# Patient Record
Sex: Male | Born: 1935 | Race: White | Hispanic: No | Smoking: Former smoker
Health system: Southern US, Community
[De-identification: ages and names within clinical notes are randomized; demographics above are authoritative.]

## PROBLEM LIST (undated history)

## (undated) DIAGNOSIS — K219 Gastro-esophageal reflux disease without esophagitis: Secondary | ICD-10-CM

## (undated) DIAGNOSIS — I471 Supraventricular tachycardia, unspecified: Secondary | ICD-10-CM

## (undated) DIAGNOSIS — D649 Anemia, unspecified: Secondary | ICD-10-CM

## (undated) DIAGNOSIS — I10 Essential (primary) hypertension: Secondary | ICD-10-CM

## (undated) DIAGNOSIS — I2510 Atherosclerotic heart disease of native coronary artery without angina pectoris: Secondary | ICD-10-CM

## (undated) DIAGNOSIS — R55 Syncope and collapse: Secondary | ICD-10-CM

## (undated) DIAGNOSIS — G1223 Primary lateral sclerosis: Secondary | ICD-10-CM

## (undated) DIAGNOSIS — K831 Obstruction of bile duct: Secondary | ICD-10-CM

## (undated) DIAGNOSIS — M4317 Spondylolisthesis, lumbosacral region: Secondary | ICD-10-CM

## (undated) HISTORY — DX: Atherosclerotic heart disease of native coronary artery without angina pectoris: I25.10

## (undated) HISTORY — DX: Supraventricular tachycardia: I47.1

## (undated) HISTORY — DX: Syncope and collapse: R55

## (undated) HISTORY — PX: CHOLECYSTECTOMY: SHX55

## (undated) HISTORY — DX: Supraventricular tachycardia, unspecified: I47.10

## (undated) HISTORY — DX: Obstruction of bile duct: K83.1

## (undated) HISTORY — DX: Essential (primary) hypertension: I10

## (undated) HISTORY — PX: CAROTID ENDARTERECTOMY: SUR193

## (undated) HISTORY — DX: Gastro-esophageal reflux disease without esophagitis: K21.9

---

## 2003-04-04 HISTORY — PX: ANGIOPLASTY / STENTING FEMORAL: SUR30

## 2004-03-01 ENCOUNTER — Ambulatory Visit: Payer: Self-pay | Admitting: Cardiology

## 2004-03-07 ENCOUNTER — Ambulatory Visit: Payer: Self-pay | Admitting: Cardiology

## 2004-03-08 ENCOUNTER — Inpatient Hospital Stay (HOSPITAL_COMMUNITY): Admission: AD | Admit: 2004-03-08 | Discharge: 2004-03-15 | Payer: Self-pay | Admitting: Cardiology

## 2004-03-08 ENCOUNTER — Inpatient Hospital Stay (HOSPITAL_BASED_OUTPATIENT_CLINIC_OR_DEPARTMENT_OTHER): Admission: RE | Admit: 2004-03-08 | Discharge: 2004-03-08 | Payer: Self-pay | Admitting: Cardiology

## 2004-03-08 ENCOUNTER — Ambulatory Visit: Payer: Self-pay | Admitting: Cardiology

## 2004-03-10 ENCOUNTER — Encounter (INDEPENDENT_AMBULATORY_CARE_PROVIDER_SITE_OTHER): Payer: Self-pay | Admitting: *Deleted

## 2004-03-22 ENCOUNTER — Ambulatory Visit: Payer: Self-pay | Admitting: Cardiology

## 2004-03-23 ENCOUNTER — Ambulatory Visit (HOSPITAL_COMMUNITY): Admission: RE | Admit: 2004-03-23 | Discharge: 2004-03-24 | Payer: Self-pay | Admitting: Cardiology

## 2004-04-11 ENCOUNTER — Ambulatory Visit: Payer: Self-pay | Admitting: Cardiology

## 2004-07-19 ENCOUNTER — Ambulatory Visit (HOSPITAL_COMMUNITY): Admission: RE | Admit: 2004-07-19 | Discharge: 2004-07-19 | Payer: Self-pay | Admitting: Cardiology

## 2004-07-21 ENCOUNTER — Ambulatory Visit: Payer: Self-pay | Admitting: Cardiology

## 2004-09-12 ENCOUNTER — Ambulatory Visit: Payer: Self-pay | Admitting: Family Medicine

## 2004-09-15 ENCOUNTER — Ambulatory Visit (HOSPITAL_COMMUNITY): Admission: RE | Admit: 2004-09-15 | Discharge: 2004-09-15 | Payer: Self-pay | Admitting: Cardiology

## 2004-09-26 ENCOUNTER — Ambulatory Visit: Payer: Self-pay | Admitting: Family Medicine

## 2005-01-23 ENCOUNTER — Ambulatory Visit: Payer: Self-pay | Admitting: Cardiology

## 2005-05-01 ENCOUNTER — Inpatient Hospital Stay (HOSPITAL_COMMUNITY): Admission: RE | Admit: 2005-05-01 | Discharge: 2005-05-02 | Payer: Self-pay | Admitting: *Deleted

## 2005-05-01 ENCOUNTER — Encounter (INDEPENDENT_AMBULATORY_CARE_PROVIDER_SITE_OTHER): Payer: Self-pay | Admitting: Specialist

## 2006-03-09 ENCOUNTER — Ambulatory Visit: Payer: Self-pay | Admitting: Cardiology

## 2006-07-16 ENCOUNTER — Ambulatory Visit: Payer: Self-pay | Admitting: Cardiology

## 2006-08-21 ENCOUNTER — Ambulatory Visit: Payer: Self-pay | Admitting: Cardiology

## 2006-08-31 ENCOUNTER — Ambulatory Visit: Payer: Self-pay | Admitting: Cardiology

## 2006-09-17 ENCOUNTER — Ambulatory Visit: Payer: Self-pay | Admitting: Family Medicine

## 2006-10-01 ENCOUNTER — Ambulatory Visit: Payer: Self-pay | Admitting: Cardiology

## 2006-12-17 ENCOUNTER — Ambulatory Visit: Payer: Self-pay | Admitting: Cardiology

## 2006-12-27 ENCOUNTER — Ambulatory Visit: Payer: Self-pay | Admitting: *Deleted

## 2007-04-27 ENCOUNTER — Emergency Department (HOSPITAL_COMMUNITY): Admission: EM | Admit: 2007-04-27 | Discharge: 2007-04-27 | Payer: Self-pay | Admitting: Emergency Medicine

## 2007-05-06 ENCOUNTER — Ambulatory Visit: Payer: Self-pay | Admitting: Cardiology

## 2007-08-07 ENCOUNTER — Ambulatory Visit (HOSPITAL_COMMUNITY): Admission: RE | Admit: 2007-08-07 | Discharge: 2007-08-07 | Payer: Self-pay | Admitting: Family Medicine

## 2007-08-25 ENCOUNTER — Emergency Department (HOSPITAL_COMMUNITY): Admission: EM | Admit: 2007-08-25 | Discharge: 2007-08-25 | Payer: Self-pay | Admitting: Emergency Medicine

## 2007-11-10 ENCOUNTER — Encounter: Admission: RE | Admit: 2007-11-10 | Discharge: 2007-11-10 | Payer: Self-pay | Admitting: Neurosurgery

## 2008-02-04 ENCOUNTER — Emergency Department (HOSPITAL_COMMUNITY): Admission: EM | Admit: 2008-02-04 | Discharge: 2008-02-04 | Payer: Self-pay | Admitting: Emergency Medicine

## 2008-03-10 ENCOUNTER — Ambulatory Visit: Payer: Self-pay | Admitting: Cardiology

## 2008-04-08 ENCOUNTER — Emergency Department (HOSPITAL_COMMUNITY): Admission: EM | Admit: 2008-04-08 | Discharge: 2008-04-08 | Payer: Self-pay | Admitting: Emergency Medicine

## 2008-04-28 ENCOUNTER — Inpatient Hospital Stay (HOSPITAL_COMMUNITY): Admission: EM | Admit: 2008-04-28 | Discharge: 2008-05-01 | Payer: Self-pay | Admitting: Emergency Medicine

## 2008-04-29 ENCOUNTER — Encounter (INDEPENDENT_AMBULATORY_CARE_PROVIDER_SITE_OTHER): Payer: Self-pay | Admitting: General Surgery

## 2008-07-06 ENCOUNTER — Encounter: Admission: RE | Admit: 2008-07-06 | Discharge: 2008-09-01 | Payer: Self-pay | Admitting: Family Medicine

## 2008-09-16 ENCOUNTER — Ambulatory Visit: Payer: Self-pay | Admitting: Cardiology

## 2008-10-27 ENCOUNTER — Encounter: Admission: RE | Admit: 2008-10-27 | Discharge: 2008-10-27 | Payer: Self-pay | Admitting: Neurosurgery

## 2008-11-06 ENCOUNTER — Inpatient Hospital Stay (HOSPITAL_COMMUNITY): Admission: RE | Admit: 2008-11-06 | Discharge: 2008-11-13 | Payer: Self-pay | Admitting: Neurosurgery

## 2008-11-10 ENCOUNTER — Ambulatory Visit: Payer: Self-pay | Admitting: Physical Medicine & Rehabilitation

## 2009-02-03 ENCOUNTER — Encounter (HOSPITAL_COMMUNITY): Admission: RE | Admit: 2009-02-03 | Discharge: 2009-03-05 | Payer: Self-pay | Admitting: Neurosurgery

## 2010-02-01 HISTORY — PX: BACLOFEN PUMP REFILL: SHX1212

## 2010-03-12 IMAGING — CT CT PELVIS W/ CM
1 of 3 series · 13 of 32 positions shown, 18 images · IV contrast (Omnipaque 300)
Comparison: None available.

CT ABDOMEN

CLINICAL DATA: Abdominal pain.  Right upper quadrant pain.

CT ABDOMEN AND PELVIS WITH CONTRAST
TECHNIQUE: Multidetector CT imaging of the abdomen and pelvis was
performed using the standard protocol following bolus
administration of intravenous contrast.
Contrast: 100 ml 6mnipaque-HLL.

[Series 2: abd_pel 5.0 b40f · axial · 0.69mm/px · z∈[-484,-68]mm · 13 of 95 slices shown, 18 images]
[im 6/95  soft-tissue]
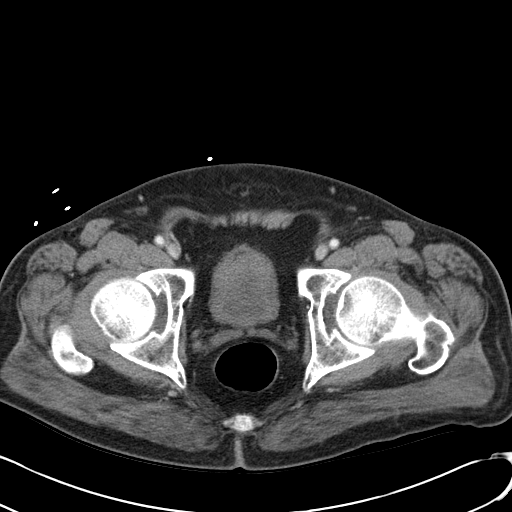
[im 6/95  bone]
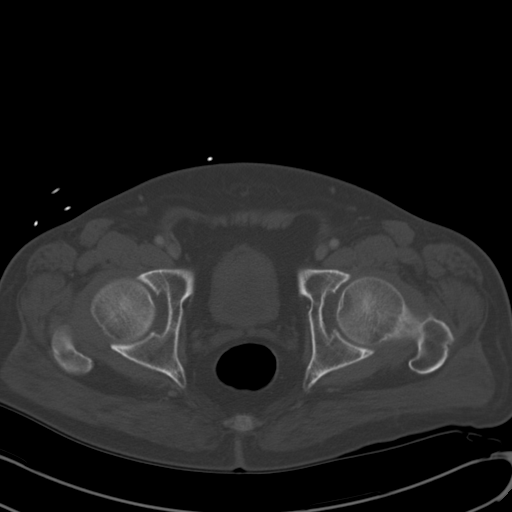
[im 16/95  soft-tissue]
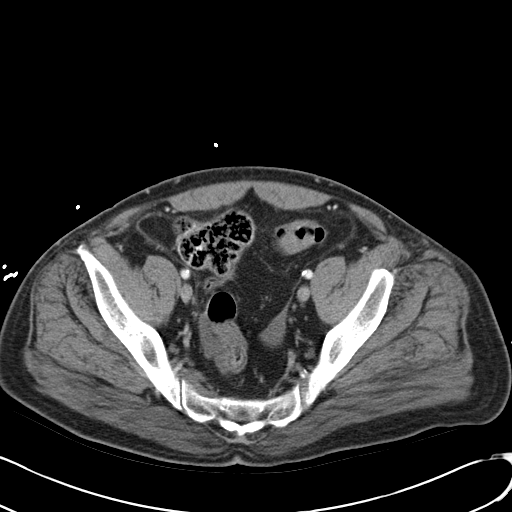
[im 21/95  soft-tissue]
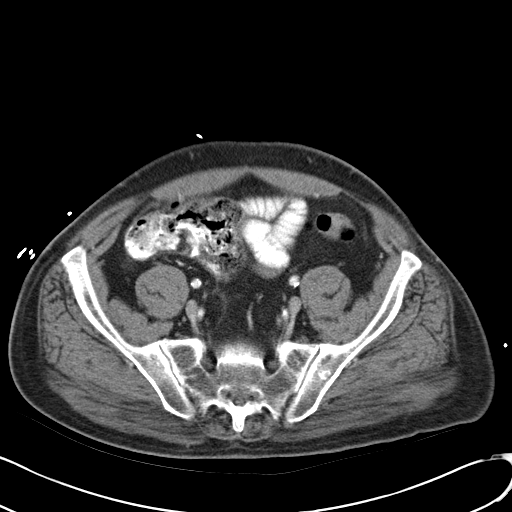
[im 27/95  soft-tissue]
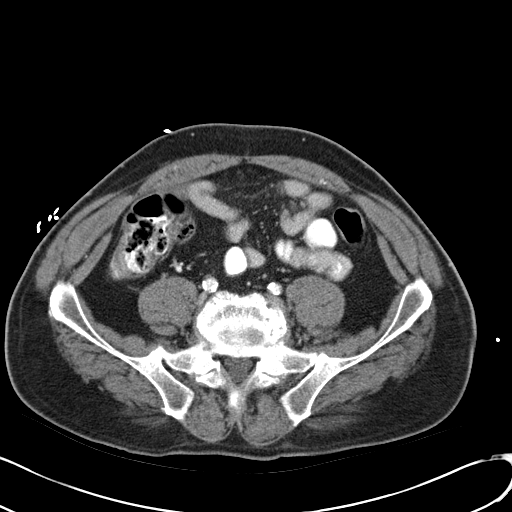
[im 37/95  soft-tissue]
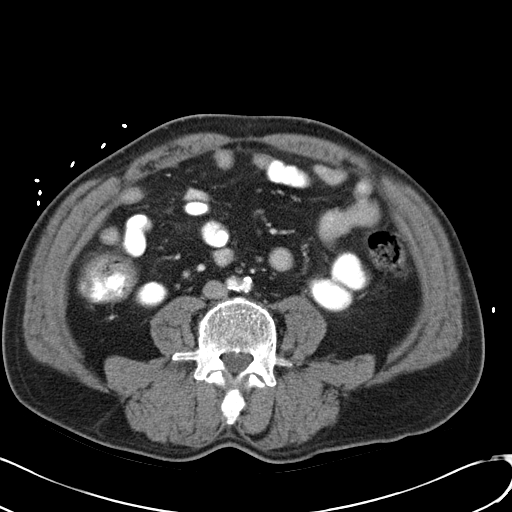
[im 42/95  soft-tissue]
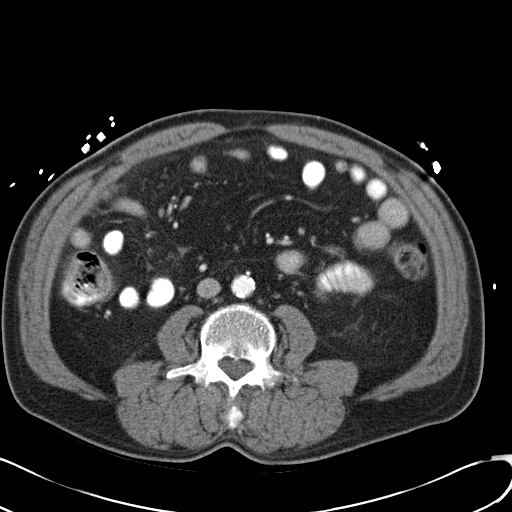
[im 53/95  soft-tissue]
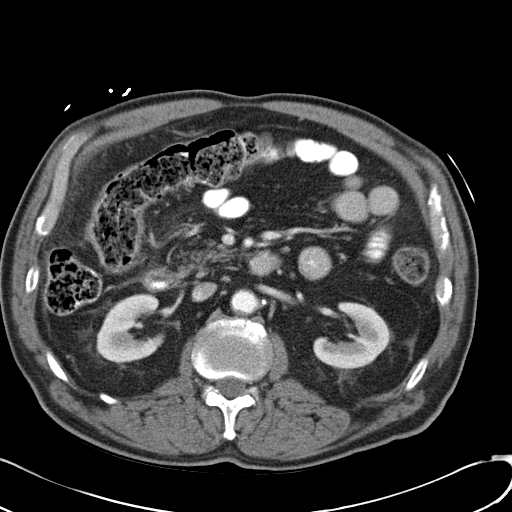
[im 58/95  soft-tissue]
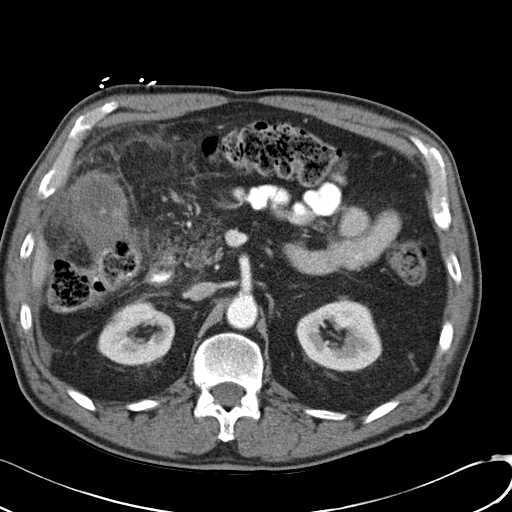
[im 68/95  soft-tissue]
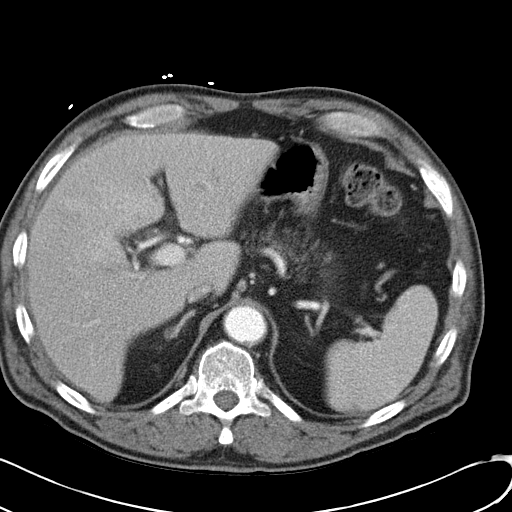
[im 68/95  bone]
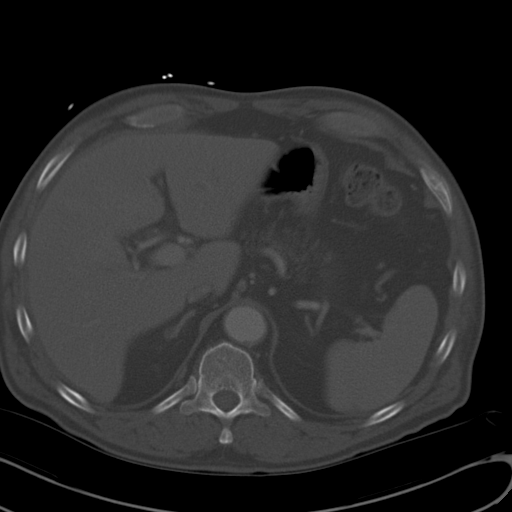
[im 74/95  soft-tissue]
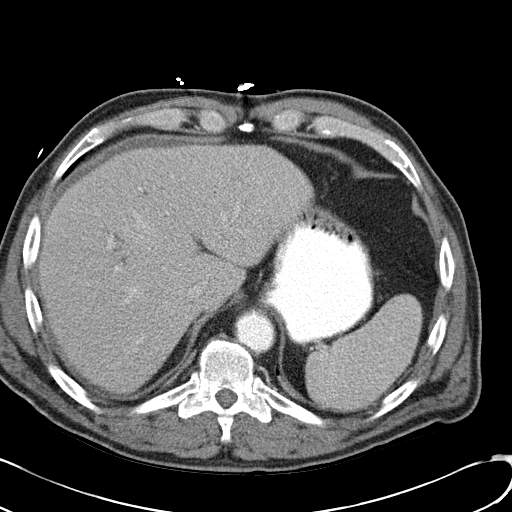
[im 74/95  lung]
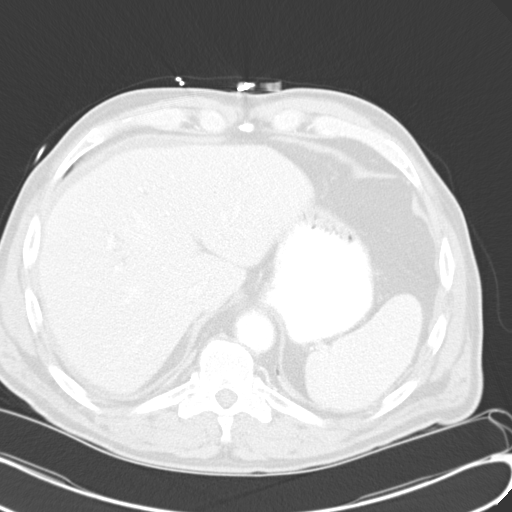
[im 79/95  soft-tissue]
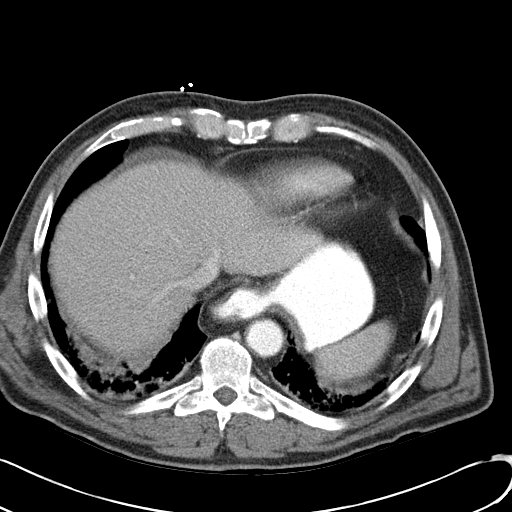
[im 79/95  lung]
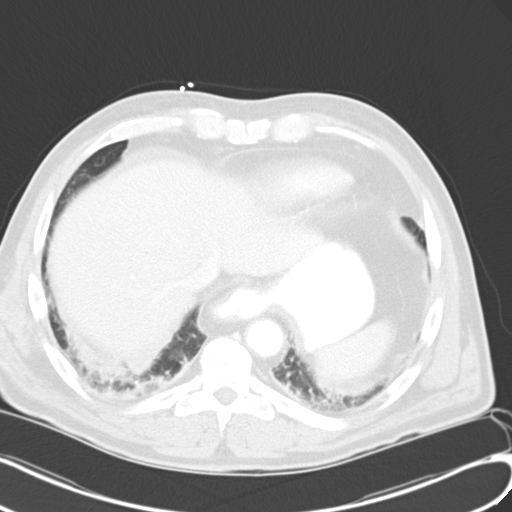
[im 84/95  lung]
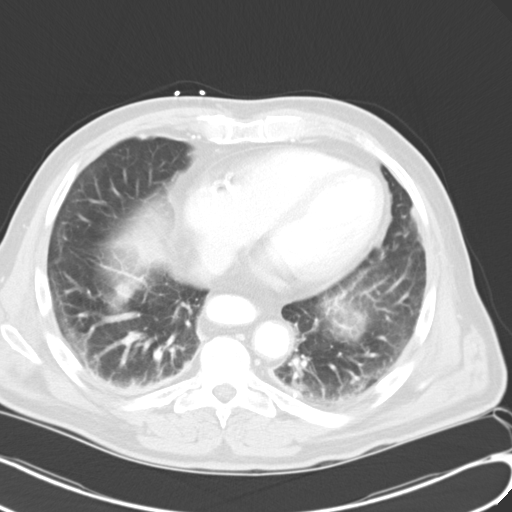
[im 89/95  soft-tissue]
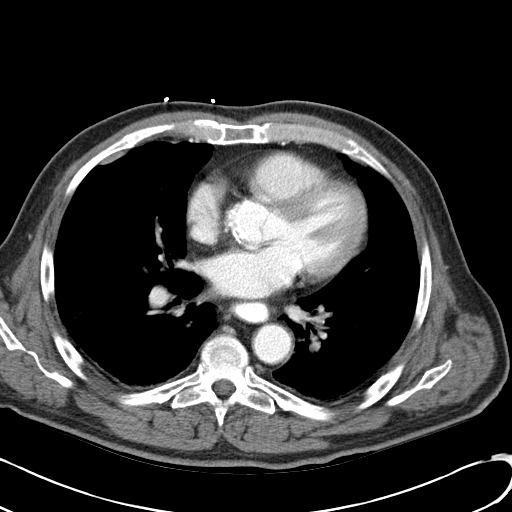
[im 89/95  lung]
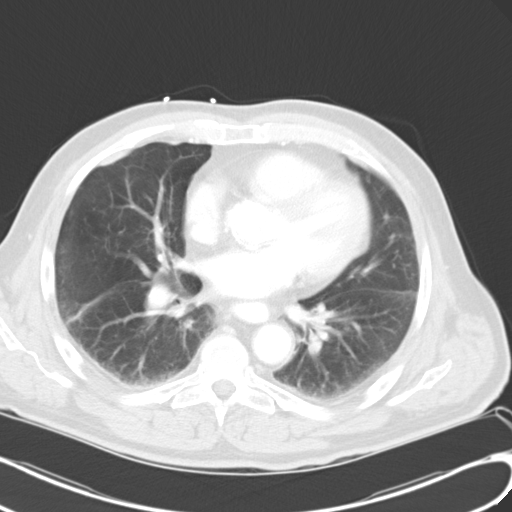

[13 of 32 positions shown; findings below may reference images not displayed]

FINDINGS: Bibasilar dependent atelectatic change.  Contrast
material is seen the distal esophagus compatible with reflux or
poor motility.  There is mild cardiomegaly.  No pleural or
pericardial effusion.

The patient has perihepatic ascites.  There is extensive
infiltration of fat about the gallbladder and the gallbladder wall
demonstrates added enhancement.  The patient has mild intrahepatic
biliary ductal dilatation.  Small calcifications identified the
liver but the liver is otherwise normal in appearance.

The adrenal glands, spleen and kidneys appear normal.  The pancreas
is fatty replaced but otherwise unremarkable.  Small bowel have a
normal CT appearance.  Compression fracture deformity the superior
plate of T12 is chronic. Bilateral L5 pars interarticularis defects
with minimal associated anterolisthesis.  No other focal bony
abnormality.
IMPRESSION: 1.  Inflammatory process in the right upper quadrant the abdomen
appears centered about the gallbladder is consistent with acute
cholecystitis.  The patient has minimal intrahepatic biliary ductal
dilatation.  No definite ductal stone is seen by CT scan.
2.  Bibasilar atelectasis.
3.  Fatty replaced of the pancreas noted.
4.  Bilateral L5 pars interarticularis defects.
5.  Gastroesophageal reflux versus poor esophageal motility.

CT PELVIS
FINDINGS: There is a small volume of free pelvic fluid.  The
patient has some diverticular disease the colon without evidence of
diverticulitis.  The colon is otherwise unremarkable in appearance.
The appendix is visualized and unremarkable.  No pelvic
lymphadenopathy.  No focal bony abnormality.
IMPRESSION: 1.  No acute finding the pelvis.
2.  Diverticulosis without diverticulitis.

## 2010-07-09 LAB — CBC
HCT: 42.1 % (ref 39.0–52.0)
Hemoglobin: 14.6 g/dL (ref 13.0–17.0)
MCHC: 34.7 g/dL (ref 30.0–36.0)
MCV: 89.3 fL (ref 78.0–100.0)
Platelets: 220 10*3/uL (ref 150–400)
RBC: 4.71 MIL/uL (ref 4.22–5.81)
RDW: 14 % (ref 11.5–15.5)
WBC: 5.1 10*3/uL (ref 4.0–10.5)

## 2010-07-09 LAB — URINALYSIS, MICROSCOPIC ONLY
Bilirubin Urine: NEGATIVE
Ketones, ur: NEGATIVE mg/dL
Nitrite: NEGATIVE
Protein, ur: NEGATIVE mg/dL
Urobilinogen, UA: 0.2 mg/dL (ref 0.0–1.0)
pH: 8 (ref 5.0–8.0)

## 2010-07-09 LAB — TYPE AND SCREEN
ABO/RH(D): O POS
Antibody Screen: NEGATIVE

## 2010-07-18 LAB — BASIC METABOLIC PANEL
BUN: 20 mg/dL (ref 6–23)
CO2: 25 mEq/L (ref 19–32)
CO2: 26 mEq/L (ref 19–32)
Chloride: 102 mEq/L (ref 96–112)
Creatinine, Ser: 1.54 mg/dL — ABNORMAL HIGH (ref 0.4–1.5)
GFR calc Af Amer: 54 mL/min — ABNORMAL LOW (ref >60)
GFR calc Af Amer: >60 mL/min (ref >60)
GFR calc non Af Amer: >60 mL/min (ref >60)
Glucose, Bld: 122 mg/dL — ABNORMAL HIGH (ref 70–99)
Potassium: 4.1 mEq/L (ref 3.5–5.1)
Sodium: 136 mEq/L (ref 135–145)

## 2010-07-18 LAB — DIFFERENTIAL
Basophils Absolute: 0 10*3/uL (ref 0.0–0.1)
Basophils Absolute: 0 10*3/uL (ref 0.0–0.1)
Basophils Absolute: 0.1 10*3/uL (ref 0.0–0.1)
Basophils Relative: 0 % (ref 0–1)
Eosinophils Absolute: 0 10*3/uL (ref 0.0–0.7)
Eosinophils Absolute: 0 10*3/uL (ref 0.0–0.7)
Eosinophils Relative: 0 % (ref 0–5)
Eosinophils Relative: 0 % (ref 0–5)
Eosinophils Relative: 1 % (ref 0–5)
Lymphocytes Relative: 2 % — ABNORMAL LOW (ref 12–46)
Lymphocytes Relative: 4 % — ABNORMAL LOW (ref 12–46)
Lymphocytes Relative: 8 % — ABNORMAL LOW (ref 12–46)
Lymphs Abs: 0.3 10*3/uL — ABNORMAL LOW (ref 0.7–4.0)
Lymphs Abs: 0.6 10*3/uL — ABNORMAL LOW (ref 0.7–4.0)
Monocytes Absolute: 0.3 10*3/uL (ref 0.1–1.0)
Monocytes Relative: 3 % (ref 3–12)
Monocytes Relative: 5 % (ref 3–12)
Monocytes Relative: 5 % (ref 3–12)
Neutro Abs: 11.5 10*3/uL — ABNORMAL HIGH (ref 1.7–7.7)
Neutro Abs: 7.9 10*3/uL — ABNORMAL HIGH (ref 1.7–7.7)
Neutrophils Relative %: 90 % — ABNORMAL HIGH (ref 43–77)
Neutrophils Relative %: 90 % — ABNORMAL HIGH (ref 43–77)

## 2010-07-18 LAB — COMPREHENSIVE METABOLIC PANEL
ALT: 15 U/L (ref 0–53)
AST: 18 U/L (ref 0–37)
Albumin: 2.3 g/dL — ABNORMAL LOW (ref 3.5–5.2)
BUN: 15 mg/dL (ref 6–23)
BUN: 18 mg/dL (ref 6–23)
CO2: 27 mEq/L (ref 19–32)
CO2: 30 mEq/L (ref 19–32)
Calcium: 8.6 mg/dL (ref 8.4–10.5)
Calcium: 9 mg/dL (ref 8.4–10.5)
Chloride: 102 mEq/L (ref 96–112)
Chloride: 104 mEq/L (ref 96–112)
Creatinine, Ser: 1.18 mg/dL (ref 0.4–1.5)
Creatinine, Ser: 1.42 mg/dL (ref 0.4–1.5)
GFR calc Af Amer: >60 mL/min (ref >60)
GFR calc Af Amer: >60 mL/min (ref >60)
GFR calc non Af Amer: 49 mL/min — ABNORMAL LOW (ref >60)
GFR calc non Af Amer: >60 mL/min (ref >60)
Glucose, Bld: 97 mg/dL (ref 70–99)
Sodium: 136 mEq/L (ref 135–145)
Total Bilirubin: 0.7 mg/dL (ref 0.3–1.2)
Total Bilirubin: 0.9 mg/dL (ref 0.3–1.2)
Total Protein: 5 g/dL — ABNORMAL LOW (ref 6.0–8.3)

## 2010-07-18 LAB — CBC
HCT: 30.8 % — ABNORMAL LOW (ref 39.0–52.0)
HCT: 32.9 % — ABNORMAL LOW (ref 39.0–52.0)
HCT: 42 % (ref 39.0–52.0)
Hemoglobin: 11.2 g/dL — ABNORMAL LOW (ref 13.0–17.0)
MCHC: 33.7 g/dL (ref 30.0–36.0)
MCHC: 33.8 g/dL (ref 30.0–36.0)
MCHC: 34 g/dL (ref 30.0–36.0)
MCHC: 34.4 g/dL (ref 30.0–36.0)
MCHC: 34.7 g/dL (ref 30.0–36.0)
MCV: 88.8 fL (ref 78.0–100.0)
MCV: 89 fL (ref 78.0–100.0)
MCV: 89.8 fL (ref 78.0–100.0)
Platelets: 201 10*3/uL (ref 150–400)
Platelets: 302 10*3/uL (ref 150–400)
RBC: 3.43 MIL/uL — ABNORMAL LOW (ref 4.22–5.81)
RBC: 3.56 MIL/uL — ABNORMAL LOW (ref 4.22–5.81)
RBC: 3.68 MIL/uL — ABNORMAL LOW (ref 4.22–5.81)
RBC: 4.01 MIL/uL — ABNORMAL LOW (ref 4.22–5.81)
RBC: 4.73 MIL/uL (ref 4.22–5.81)
RDW: 13.3 % (ref 11.5–15.5)
RDW: 13.7 % (ref 11.5–15.5)
WBC: 13 10*3/uL — ABNORMAL HIGH (ref 4.0–10.5)
WBC: 8.7 10*3/uL (ref 4.0–10.5)

## 2010-07-18 LAB — URINALYSIS, ROUTINE W REFLEX MICROSCOPIC
Leukocytes, UA: NEGATIVE
Protein, ur: 30 mg/dL — AB
Urobilinogen, UA: 0.2 mg/dL (ref 0.0–1.0)

## 2010-07-18 LAB — POCT I-STAT, CHEM 8
BUN: 21 mg/dL (ref 6–23)
Calcium, Ion: 1.17 mmol/L (ref 1.12–1.32)
Chloride: 104 mEq/L (ref 96–112)
Creatinine, Ser: 1.4 mg/dL (ref 0.4–1.5)

## 2010-07-18 LAB — LACTIC ACID, PLASMA: Lactic Acid, Venous: 2.3 mmol/L — ABNORMAL HIGH (ref 0.5–2.2)

## 2010-07-18 LAB — LIPASE, BLOOD: Lipase: 16 U/L (ref 11–59)

## 2010-07-18 LAB — URINE MICROSCOPIC-ADD ON

## 2010-08-02 ENCOUNTER — Emergency Department (HOSPITAL_COMMUNITY): Payer: Medicare Other

## 2010-08-02 ENCOUNTER — Emergency Department (HOSPITAL_COMMUNITY)
Admission: EM | Admit: 2010-08-02 | Discharge: 2010-08-02 | Disposition: A | Discharge status: Home / Self Care | Payer: Medicare Other | Attending: Emergency Medicine | Admitting: Emergency Medicine

## 2010-08-02 DIAGNOSIS — M129 Arthropathy, unspecified: Secondary | ICD-10-CM | POA: Insufficient documentation

## 2010-08-02 DIAGNOSIS — R29898 Other symptoms and signs involving the musculoskeletal system: Secondary | ICD-10-CM | POA: Insufficient documentation

## 2010-08-02 DIAGNOSIS — M5430 Sciatica, unspecified side: Secondary | ICD-10-CM | POA: Insufficient documentation

## 2010-08-02 DIAGNOSIS — M25559 Pain in unspecified hip: Secondary | ICD-10-CM | POA: Insufficient documentation

## 2010-08-02 DIAGNOSIS — I519 Heart disease, unspecified: Secondary | ICD-10-CM | POA: Insufficient documentation

## 2010-08-02 DIAGNOSIS — Z981 Arthrodesis status: Secondary | ICD-10-CM | POA: Insufficient documentation

## 2010-08-02 DIAGNOSIS — Z7982 Long term (current) use of aspirin: Secondary | ICD-10-CM | POA: Insufficient documentation

## 2010-08-02 DIAGNOSIS — E7800 Pure hypercholesterolemia, unspecified: Secondary | ICD-10-CM | POA: Insufficient documentation

## 2010-08-02 DIAGNOSIS — G8929 Other chronic pain: Secondary | ICD-10-CM | POA: Insufficient documentation

## 2010-08-02 DIAGNOSIS — M6250 Muscle wasting and atrophy, not elsewhere classified, unspecified site: Secondary | ICD-10-CM | POA: Insufficient documentation

## 2010-08-02 DIAGNOSIS — Z9889 Other specified postprocedural states: Secondary | ICD-10-CM | POA: Insufficient documentation

## 2010-08-02 DIAGNOSIS — Z79899 Other long term (current) drug therapy: Secondary | ICD-10-CM | POA: Insufficient documentation

## 2010-08-02 DIAGNOSIS — I10 Essential (primary) hypertension: Secondary | ICD-10-CM | POA: Insufficient documentation

## 2010-08-02 DIAGNOSIS — M79609 Pain in unspecified limb: Secondary | ICD-10-CM | POA: Insufficient documentation

## 2010-08-02 DIAGNOSIS — R209 Unspecified disturbances of skin sensation: Secondary | ICD-10-CM | POA: Insufficient documentation

## 2010-08-16 NOTE — Assessment & Plan Note (Signed)
[facility]                          [facility] NOTE   NAME:[name], [name]                        MRN:          [id number]  DATE:05/06/2007                            DOB:          07-13-1935    CARDIOLOGIST:  Luis Abed, MD, [facility]   PRIMARY CARE PHYSICIAN:  Delaney Meigs, MD   REASON FOR VISIT:  Four month followup.   HISTORY OF PRESENT ILLNESS:  [name] is a 75 year old male patient,  with a history of coronary artery disease, status post Taxus drug-  eluting stent placement to the RCA in December 2005, who presents to the  office today for followup.   Since last being seen, the patient presented to [facility] Emergency  Room with complaints of near syncope.  This occurred after having a  medicated ointment applied to his back for back pain.  He states it felt  extremely cold.  He denies any unusual odors with it.  He started  vomiting several times and eventually became near syncopal and went to  the emergency room.  He was treated with Phenergan and discharged to  home.  He has had no further episodes of vomiting or near syncope since  that time.   He denies any intercurrent development of exertional chest discomfort.  He denies any significant dyspnea on exertion.  He describes NYHA Class  2 symptoms.  He denies orthopnea, PND, pedal edema.  Denies any syncope  or palpitations.   CURRENT MEDICATIONS:  1. Aspirin 325 mg daily.  2. Vytorin 20 mg daily.  3. Micardis 80 mg daily.   ALLERGIES:  NO KNOWN DRUG ALLERGIES.   PHYSICAL EXAMINATION:  He is a well-nourished, well-developed male.  Blood pressure is 124/74.  Pulse 72.  Weight 164 pounds.  HEENT:  Normal.  NECK:  Without JVD.  CARDIAC:  S1 and S2, regular rate and rhythm, without murmur.  LUNGS:  Clear to auscultation bilaterally without wheezes, rhonchi, or  rales.  ABDOMEN:  Soft, nontender.  Normoactive bowel sounds.  No organomegaly.  EXTREMITIES:   Without edema.  NEUROLOGIC:  He is alert and oriented x3.  Cranial nerves 2-12 grossly  intact.   IMPRESSION:  1. Recent episode of near syncope.  Probable vasovagal reaction.  2. Left hip pain.      a.     Degenerative disk disease.      b.     Normal ankle brachial indices, September 2008.  3. Coronary artery disease.      a.     Status post Taxus stent to the right coronary artery in       2005.      b.     Low risk adenosine Cardiolite in 2008.  4. Carotid artery disease.      a.     Status post bilateral carotid endarterectomies.  5. History of abnormal chest CT in the past.  6. Chronic obstructive pulmonary disease.  7. Hypertension.  8. Renal insufficiency.  9. Gastroesophageal reflux disease.  10.Ex-smoker.   DISCUSSION:  [name] presents for followup.  He recently had an  episode of near syncope.  It sounds as though he had some type of  vasovagal reaction with the application of medicated ointment.  He has  had no further symptoms.  He denies any symptoms consistent with angina.   PLAN:  1. No further workup at this point in time.  2. Continue medications as listed above.  3. Follow up in 6 months or sooner p.r.n.      Tereso Newcomer, PA-C  Electronically Signed      Luis Abed, MD, [facility]  Electronically Signed   SW/MedQ  DD: 05/06/2007  DT: 05/06/2007  Job #: [id number]   cc:   Delaney Meigs, M.D.

## 2010-08-16 NOTE — Procedures (Signed)
CAROTID DUPLEX EXAM   INDICATION:  Follow up carotid artery disease.   HISTORY:  Diabetes:  No.  Cardiac:  PTCA in the past.  Hypertension:  Yes.  Smoking:  Quit in 1999.  Previous Surgery:  Left carotid endarterectomy with DPA on 03/10/2004,  right carotid endarterectomy with DPA on 05/01/2005, both by Dr. Madilyn Fireman.  CV History:  Amaurosis Fugax  No, Paresthesias No, Hemiparesis  No.                                       RIGHT             LEFT  Brachial systolic pressure:         130.              134.  Brachial Doppler waveforms:         Biphasic.         Biphasic.  Vertebral direction of flow:        Antegrade.        Antegrade.  DUPLEX VELOCITIES (cm/sec)  CCA peak systolic                   61.               80.  ECA peak systolic                   144.              93.  ICA peak systolic                   91.               104.  ICA end diastolic                   37.               29.  PLAQUE MORPHOLOGY:                  Mixed.            Mixed.  PLAQUE AMOUNT:                      Mild.             Mild.  PLAQUE LOCATION:                    ICA, ECA.         ICA.   IMPRESSION:  1. 20-39% bilateral internal carotid artery stenosis status post      endarterectomies.  2. Mild right external carotid artery stenosis.  3. Study unchanged from 12/19/2005.   ___________________________________________  P. Liliane Bade, M.D.   DP/MEDQ  D:  12/27/2006  T:  12/28/2006  Job:  [id number]

## 2010-08-16 NOTE — Op Note (Signed)
NAMERANBIR, CHEW                 ACCOUNT NO.:  [id number]   MEDICAL RECORD NO.:  [id number]          PATIENT TYPE:  INP   LOCATION:  IC03                          FACILITY:  [facility]   PHYSICIAN:  Tilford Pillar, MD      DATE OF BIRTH:  1935-06-12   DATE OF PROCEDURE:  04/29/2008  DATE OF DISCHARGE:                               OPERATIVE REPORT   PREOPERATIVE DIAGNOSIS:  Acute cholecystitis.   POSTOPERATIVE DIAGNOSIS:  Gangrenous cholecystitis.   PROCEDURES:  1. Laparoscopic cholecystectomy.  This will be noted with the 22      modifier due to the extended length of the procedure, due to the      increased surgical difficulty.  2. Intraabdominal drain placement.   SURGEON:  Tilford Pillar, MD   ANESTHESIA:  General endotracheal, local anesthetic 0.5% Sensorcaine  plain.   SPECIMEN:  Gallbladder.   ESTIMATED BLOOD LOSS:  Less than 450 mL.   INDICATIONS:  The patient is a 75 year old male with a history of  coronary artery disease who presented to [facility] with acute  onset of epigastric and right upper quadrant abdominal pain.  He was  seen and initially evaluated by the emergency room physician.  He was  noted to have inflammatory changes consistent with acute cholecystitis.  Surgical consultation was obtained.  On evaluation, it was confirmed  that the patient did have symptoms consistent with acute cholecystitis.  He was started initially on antibiotic therapy.  He was resuscitated  with IV fluid hydration and kept in n.p.o. status.  His symptomatology  actually improved significantly as well as a decrease in his red blood  cell count; however, he did continue to have severe right upper quadrant  abdominal pain on palpation.  Risks, benefits, and alternatives of a  laparoscopic, possible open, cholecystectomy were discussed at length  with the patient including but not limited to the risk of bleeding,  infection, bile leak, small bowel injury, common bile duct  injury as  well as the possibility of intraoperative cardiac and pulmonary events.  The patient's questions were answered and concerns were addressed, and  the patient was consented for the planned procedure.   OPERATION:  The patient was taken to the operating room, and was placed  in supine position on the operating room table, at which time the  general anesthetic was administered.  Once the patient was asleep, he  was endotracheally intubated by Anesthesia.  At this time, his abdomen  was prepped with DuraPrep solution and draped in standard fashion.  A  stab incision was created supraumbilically with an 11-blade scalpel.  Additional dissection down through the subcuticular tissue was carried  out using a Kocher clamp, which was utilized to grasp the anterior  abdominal wall fascia and move this anteriorly.  A Veress needle was  inserted.  Saline drop test was utilized to confirm intraperitoneal  placement and then pneumoperitoneum was initiated.  Once sufficient  pneumoperitoneum was obtained, an 11-mm trocar was inserted over the  laparoscope allowing visualization of trocar entering into the  peritoneum cavity.  At  this time, the inner cannula was removed.  The  laparoscope was reinserted.  There was no evidence of any trocar or  Veress needle placement injury.  At this time, the patient was placed in  a reverse Trendelenburg left lateral decubitus position.  The remaining  trocars were placed:  An 11-mm trocar in the epigastrium, a 5-mm trocar  was placed in the midline between the two 11-mm trocars, and a 5-mm  trocar placed in the right lateral abdominal wall.  At this time, I did  note that there was a significant inflammatory phlegmon over the right  lobe of the liver with the omentum adherent over the right lobe and  adherent to the anterior abdominal wall.  This was bluntly stripped from  the anterior abdominal wall with a regular grasper and as the omentum  was being  retracted inferiorly, the dome of the gallbladder was  identified.  This was bile-esque in color and suspicious for a  gangrenous appearance.  The abdomen was extremely taut.  I did opt to  drain the gallbladder with a Weck needle.  The returning aspirate was  clear, consistent with hydrops of the gallbladder.  At this time, I  continued accommodation of sharp, blunt, and electrocautery dissection  to free the gallbladder from the surrounding inflammatory phlegmon.  I  continued this down to the infundibulum which was significantly  thickened.  I did identify the stomach from what appeared to be the  beginning of the duodenum.  These were carefully dissected free from the  surrounding inflammatory phlegmon.  With gentle retraction caudal on the  suspected infundibulum of the gallbladder, I did see a structure that  was suspicious for the cystic duct.  This was clearly being tented and  was adjacent to this inflammatory phlegmon.  I, therefore, carefully  continued dissection into this area using a suction irrigator to bluntly  dissect through the inflammatory mass.  I did identify a structure which  was clearly heading towards the gallbladder onto the anterior surface of  the gallbladder.  Three EndoClips were placed proximally and this was  divided distal to these 3 clips.  No backbleeding was evident.  There  was no evidence of any bile.  At this time, I continued dissection with  a careful blunt dissection around the infundibulum.  This did increase  the duration of the operation due to the significant inflammatory effect  in this area.  Suspicious that the cystic duct was well inflamed or well  capsulated within this inflammatory mass and not wishing to injure the  common bile duct, I did opt to convert to dome-down approach to the  gallbladder.  At this time, electrocautery was utilized to dissect the  gallbladder free from the fundus down towards the infundibulum off of  the  gallbladder fossa.  Electrocautery was utilized to obtain hemostasis  in the gallbladder fossa as this as this was being conducted.  In  addition, I ensured I stayed very close to the wall of the gallbladder.  At times, due to the friable nature of the posterior wall of the  gallbladder, I was suspicious that some of the __________ may have been  left adjacent and attached to the gallbladder fossa.  I continued this  dissection down until I clearly was left with a single pedicle.  During  this dissection, a large vessel had been identified and was controlled  with EndoClips.  At this time, I did copiously irrigate the field.  The  returning aspirate was clear.  With continued caudal retraction of the  remnant of the gallbladder, I could clearly see that the common bile  duct was being tented and, at this point, I used an Endoloop x2 to  ligate the cystic duct ensuring that I did not occlude the common bile  duct at its ligation.  Distal to this previously placed suture, I then  divided the infundibulum of the gallbladder.  I did leave a small a  small remnant distal to the placed sutures.  Obvious reduction of round  ductal structure was seen after the division.  The gallbladder was now  free, was placed into an EndoCatch bag and was placed in the right lower  quadrant.  Copious irrigation was then irrigated into the field and  until the returning aspirate was clear.  I opted to place a piece of  Surgicel into the gallbladder fossa due to the overall appearance of the  fossa.  I then brought a 10-flat JP drain through the epigastric trocar  site with the tail of the drain being pulled through the right lateral  abdominal wall 5-mm trocar site.  This drain was positioned into the  gallbladder fossa and, at this time, I turned my attention to the  closure.   Using an Endoclose suture passing device, I then passed a 2-0 Vicryl  suture through both of the 11-mm trocar sites with the sutures  in place  and then removed the gallbladder through the epigastric trocar site.  Due to the thickened nature of the gallbladder wall, I did have to  sharply enlarge the epigastric trocar site and additionally use some  blunt dissection in order to successfully remove the gallbladder.  During this process, the EndoCatch bag was torn but inspection was  complete and this was placed on the back table with the gallbladder.  The area was then copiously irrigated with sterile saline and then the  pneumoperitoneum was evacuated.  I did close the Vicryl sutures at this  time.  I used additional 2-0 Vicryl to further reinforce the closure at  the epigastric trocar site.  Local anesthetic was instilled.  A 4-0  Monocryl was utilized to reapproximate the remaining 3 trocar sites and  a 3-0 nylon suture was utilized to secure the drain to the skin.  The  skin was then washed and dried with a moist and dry towel.  Benzoin was  applied around the incision.  Half-inch Steri-Strips were placed.  This  was done after the injection of local.  A drain sponge was placed around  the drain and the drapes removed.  The patient was allowed to come out  of general anesthetic and was transferred back to regular hospital bed.  He was transferred to the Post-Anesthetic Care Unit in a stable  condition.  At the conclusion of the procedure, all instruments, sponge,  and needle counts were correct.  The patient tolerated the procedure  well.      Tilford Pillar, MD  Electronically Signed     BZ/MEDQ  D:  04/29/2008  T:  04/30/2008  Job:  [id number]   cc:   Primary Care Physician

## 2010-08-16 NOTE — Discharge Summary (Signed)
[name], [name]                 ACCOUNT NO.:  [id number]   MEDICAL RECORD NO.:  [id number]          PATIENT TYPE:  INP   LOCATION:  3030                         FACILITY:  [facility]   PHYSICIAN:  Hilda Lias, M.D.   DATE OF BIRTH:  26-May-1935   DATE OF ADMISSION:  11/06/2008  DATE OF DISCHARGE:  11/13/2008                               DISCHARGE SUMMARY   ADMISSION DIAGNOSES:  1. L5-S1 spondylolisthesis.  2. Upper motor neuron disease.   FINAL DIAGNOSES:  1. L5-S1 spondylolisthesis.  2. Upper motor neuron disease.   CLINICAL HISTORY:  The patient was admitted because of back pain, worse  in both legs.  The patient has a upper motor neuron disease, which has  been followed up at [facility].  X-ray shows spondylolisthesis at L5-S1.  Because of worsened pain, he wanted to proceed with surgery.  Laboratory  normal.   COURSE IN THE HOSPITAL:  He was taken to surgery on November 06, 2008, and  L5-S1 fusion was done.  At the beginning, the patient was in quite a bit  of pain but since he is ambulating, has minimal discomfort, he wants to  go home today.   CONDITION ON DISCHARGE:  Improving.   MEDICATIONS:  Vicodin and diazepam.   DIET:  Regular.   ACTIVITY:  Not to drive.   FOLLOWUP:  He will be seen by me in 3 weeks.           ______________________________  Hilda Lias, M.D.     EB/MEDQ  D:  11/13/2008  T:  11/13/2008  Job:  [id number]

## 2010-08-16 NOTE — Assessment & Plan Note (Signed)
[facility]                          [facility]ICE NOTE   NAME:[name], [name]                        MRN:          [id number]  DATE:08/21/2006                            DOB:          10-06-1935    [name] is doing well.  He does have coronary disease.  He did have  an episode of chest discomfort that took him to [facility].  He  was seen in consultation by our group.  He was treated there with  nitroglycerin and a GI cocktail.  He was stable.  His troponins were  normal.  He stabilized.  We felt that he had not had an MI.  He was  allowed to go home and he is now back for followup.  Since being at  home, he has not had any recurrent chest pain.   PAST MEDICAL HISTORY:   ALLERGIES:  No known drug allergies.   MEDICATIONS:  1. Micardis 80.  2. Lopressor 25 b.i.d.  3. Plavix 75.  4. Aspirin 325.  5. Vytorin 10/20.   OTHER MEDICAL PROBLEMS:  See the list below.   REVIEW OF SYSTEMS:  He feels great today.  He has some mild discomfort  in his hip when he tries to walk.  Otherwise, his review of systems is  negative.   PHYSICAL EXAM:  Blood pressure today is 112/68, pulse 80, weight is  164.2 pounds.  The patient is oriented to person, time, and place.  Affect is normal.  He is here with family member today.  HEENT:  Reveals no xanthelasma.  He has normal extraocular motions.  He  has his carotid endarterectomy scars.  I do not hear any bruits.  There  is no jugular venous distension.  LUNGS:  Clear.  Respiratory effort is not labored.  CARDIAC:  Reveals an S1 with an S2.  There are no clicks or significant  murmurs.  ABDOMEN:  Soft.  He has normal bowel sounds.  He has no peripheral edema.  There are no major musculoskeletal  deformities.   No labs are done today.  He has not yet had his outpatient Cardiolite  scan.   PROBLEMS:  1. Coronary artery disease post drug-eluting stent to the right      coronary artery in  2005.  He is 2 years out.  He is doing very      well.  He wants to remain on Plavix.  It will be safe for him to      stop it at some point, but since he has had recurrent pain, we will      not stop it at this time.  Recent episode of chest pain in the      hospital, leading to hospitalization with no myocardial infarction.      We will proceed with an adenosine Cardiolite.  2. Status post carotid endarterectomy in December of 2005 followed by      the other side in January of 2007.  3. History of an abnormal CT of the chest in the past that does not  need to be repeated.  Next episode of vasovagal episodes      chronically that are stable.  4. Hyperlipidemia, treated.  5. Hypertension, treated.  6. History of creatinine 1.5.   Mr. Careaga is stable.  We will arrange for his adenosine Myoview and  then I will see him for followup.     Luis Abed, MD, [facility]  Electronically Signed    JDK/MedQ  DD: 08/21/2006  DT: 08/21/2006  Job #: [id number]   cc:   Delaney Meigs, M.D.  Balinda Quails, M.D.

## 2010-08-16 NOTE — Assessment & Plan Note (Signed)
[facility]                          [facility]   NAME:Posch, FRITZ CAUTHON                        MRN:          [id number]  DATE:03/10/2008                            DOB:          September 15, 1935    Mr. Dion is here for Cardiology followup.  He was recently at [facility].  He had the sudden onset of palpitations and feeling  poorly.  EMS found him to be in a rapid narrow complex tachycardia.  He  received adenosine and he broke to sinus rhythm.  He was assessed at  [facility] and was stabilized and ultimately was sent home.  His  Micardis was changed to diltiazem.  He has not had any palpitations  since then.  He is going about full activities.  He is here today with  his son who helps to oversee all the healthcare in his family.   The patient does have known coronary disease.  He received a Taxus stent  of the right coronary artery in 2005.  His last Cardiolite was in 2008  and revealed no marked ischemia.  He also has had bilateral carotid  endarterectomies.   PAST MEDICAL HISTORY:   ALLERGIES:  No known drug allergies.   MEDICATIONS:  Aspirin, Vytorin, and diltiazem 240 once a day.   OTHER MEDICAL PROBLEMS:  See the complete list on my note of May 06, 2007.   REVIEW OF SYSTEMS:  He has no GI or GU symptoms.  He has no fevers or  chills.  He has no headaches rashes.  Otherwise, his review of systems  is negative.   PHYSICAL EXAMINATION:  VITAL SIGNS:  Blood pressure is 147/88 with a  pulse of 69.  GENERAL:  The patient is oriented to person, time, and place.  Affect is  normal.  HEENT:  No xanthelasma.  He has normal extraocular motion.  NECK:  There are no carotid bruits.  There is no jugular venous  distention.  LUNGS:  Clear.  Respiratory effort is not labored.  CARDIAC:  An S1 with an S2.  There are no clicks or significant murmurs.  ABDOMEN:  Soft.  He has no peripheral edema.   No labs.   The patient's  EKG today reveals normal sinus rhythm.   I have reviewed extensively the emergency room data from [facility].  I  have reviewed also today's EKG showing that he is in sinus rhythm.   PROBLEMS:  1. History of near syncope in the past that we thought was vasovagal.      It is possible that he may have had some tachy palpitations in the      past, although he has not described palpitations.  2. Hip hip pain with degenerative disk disease.  3. Normal brachial and ankle indices.  4. Coronary artery disease status post Taxus stent to the right      coronary artery in 2005 with a low-risk Cardiolite in 2008.  5. Carotid disease status post bilateral carotid endarterectomies.  6. History of an abnormal chest CT done in the past.  7. Chronic obstructive pulmonary disease.  8. Hypertension.  9. Renal insufficiency.  10.Gastroesophageal reflux disease.  11.Ex-smoker.  12.Some type of leg weakness that is being evaluated.  He has seen Dr.      Jeral Fruit of Neurosurgery and Dr. Kelli Hope of Neurology in      [place] and Dr. Lysbeth Galas is aware of all these issues also.  He is      being sent to [facility] for further neurologic testing.  The son      mentions that the question of amyotrophic lateral sclerosis has      been raised, but this is not a definite diagnosis at this time.  13.New documentation of supraventricular tachycardia.  We will      continue him on diltiazem.  No further workup at this time.  When I      see him back in 3 months, we will decide if we need to reassess his      left ventricular function and we will see if further testing is      needed relative to his arrhythmias.     Luis Abed, MD, [facility]  Electronically Signed    JDK/MedQ  DD: 03/10/2008  DT: 03/11/2008  Job #: [id number]   cc:   Delaney Meigs, M.D.

## 2010-08-16 NOTE — Assessment & Plan Note (Signed)
[facility]                          [facility]   NAME:Goatley, KIEN MIRSKY                        MRN:          [id number]  DATE:09/16/2008                            DOB:          10/05/1935    Mr. Maffei is doing well.  He has had bilateral carotid  endarterectomies.  He also has had near syncopal episode that we thought  was vasovagal.  Later, we note he learned that he had some rapid  supraventricular tachycardia.  This has been stable.   He is not having chest pain.  He is not having any syncope or  presyncope.   PAST MEDICAL HISTORY:   ALLERGIES:  No known drug allergies.   MEDICATIONS:  Aspirin, Vytorin, diltiazem, and fish oil.   OTHER MEDICAL PROBLEMS:  See the list below.   REVIEW OF SYSTEMS:  Today, he has no fevers or chills.  There are no  skin rashes.  He has no headaches.  There is no change in his vision or  his hearing.  He has no cough or shortness of breath.  He has low back  pain.  He has no GI or GU complaints.  All other systems are reviewed  and are negative.   PHYSICAL EXAMINATION:  VITAL SIGNS:  Blood pressure is 128/74 with a  pulse of 57.  Weight is 158 pounds.  GENERAL:  The patient is oriented to person, time and place.  Affect is  normal.  HEENT:  No xanthelasma.  He has normal extraocular motion.  NECK:  There are no carotid bruits.  There is no jugular venous  distention.  LUNGS:  Clear.  Respiratory effort is not labored.  CARDIAC:  S1 with an S2.  There are no clicks or significant murmurs.  ABDOMEN:  Soft.  EXTREMITIES:  He has no peripheral edema.   No labs are done today.   Problems are listed on my note of March 10, 2008.  He has not had any  recurrent syncope.  His coronary artery disease is stable.  His carotid  disease is stable.  He is not having any recurrent supraventricular  tachycardia.  No change in his meds.  I will see him back in 6 months.      Luis Abed, MD,  [facility]  Electronically Signed    JDK/MedQ  DD: 09/16/2008  DT: 09/17/2008  Job #: [id number]   cc:   Delaney Meigs, M.D.

## 2010-08-16 NOTE — Op Note (Signed)
NAMEHEYDEN, JABER                 ACCOUNT NO.:  [id number]   MEDICAL RECORD NO.:  [id number]          PATIENT TYPE:  INP   LOCATION:  3030                         FACILITY:  [facility]   PHYSICIAN:  Hilda Lias, M.D.   DATE OF BIRTH:  1935/12/31   DATE OF PROCEDURE:  11/06/2008  DATE OF DISCHARGE:                               OPERATIVE REPORT   PREOPERATIVE DIAGNOSES:  1. L5-S1 spondylolisthesis grade I, grade II, with chronic      radiculopathy.  2. Upper motor neuron disease.   POSTOPERATIVE DIAGNOSES:  1. L5-S1 spondylolisthesis grade I, grade II, with chronic      radiculopathy.  2. Upper motor neuron disease.   PROCEDURES:  1. L5 laminectomy and facetectomy; bilateral L5-S1 diskectomy;      interbody fusion with cages, 12 x 22 with autograft and BMP,      pedicle screws to L5 and S1, posterolateral arthrodesis with      autograft and BMP.  2. Cell Saver.  3. C-arm.   SURGEON:  Hilda Lias, MD   ASSISTANT:  Clydene Fake, MD   CLINICAL HISTORY:  Ms. Cozzolino is a 75 year old gentleman complaining of  back pain radiating to both legs.  The patient had been followed at [facility] because of upper motor neuron disease.  X-ray showed  spondylolisthesis of L5-S1, grade I, grade II.  The patient wants to  proceed with surgery.   DESCRIPTION OF PROCEDURE:  The patient was taken to the OR, and after  intubation, he was positioned in a prone manner.  The back was cleaned  with DuraPrep.  A midline incision from L4-5 down to L5-S1 was made and  muscles were retracted all the way laterally until we were able to see  the transverse process.  Then, indeed, we found then the lamina of the  facet of L5 was absolutely loose.  Then, with the drill and with the  [place], we went ahead and proceeded with removal of spinous process of  L5, the lamina as well as the facet.  The patient had quite a bit of  adhesion, mostly compromising the L5 nerve root.  Lysis was  accomplished.   We __________ thecal sac, we went into this space, and  total bilateral gross diskectomy was achieved.  We had to drill past the  pedicles to be able to get into the disk space.  Once total diskectomy  was achieved, the endplates were drilled.  Then, 2 cages of 12 x 22 with  autograft and BMP were set bilaterally.  Using the C-arm in the AP and  lateral views, we probed the pedicles of L5-S1.  We introduced 4 pedicle  screws, 5.5 x 45, at the level of S1 and 5.5 x 40 at the level of L5.  Prior to introducing the pedicle, we probed the area just to be sure  that we were surrounded by bone.  The screw was connected with rod  and capped in place with caps.  We went laterally and we removed the  periosteum of L5-S1, and a mix of BMP  and autograft was used for  arthrodesis.  Valsalva maneuver was negative.  Then, the area was closed  using Vicryl and Steri-Strips.           ______________________________  Hilda Lias, M.D.     EB/MEDQ  D:  11/06/2008  T:  11/06/2008  Job:  [id number]

## 2010-08-16 NOTE — Assessment & Plan Note (Signed)
[facility]ALTHCARE                          [name] NOTE   NAME:[name], [name]                        MRN:          [id number]  DATE:12/17/2006                            DOB:          [birth year]    PRIMARY CARE PHYSICIAN:  [name], M.D.   CARDIOLOGIST:  Lucas Abed, MD, [facility].   REASON FOR VISIT:  Three month followup.   HISTORY OF PRESENT ILLNESS:  [name] is a 75 year old male patient  with a history of CAD, status post Taxus drug-eluting stent to the RCA  in December, 2005, who presents to the [facility] today for followup.  When  I last saw him, he had followed up from a stress Cardiolite study that  revealed very slight inferior ischemia.  He was treated medically.   He notes he is doing well.  He denies any chest pain or shortness of  breath.  He denies any syncope or near syncope.  He denies any  orthopnea, PND, or pedal edema.  He is able to exert himself without any  symptoms of chest pain or shortness of breath; however, he does note  some left hip pain.  He has seen [name], who thinks he may have some  sciatica.  He does have some tingling and numbness that seems to radiate  down his leg; however, he does tell me he has some symptoms of left  buttock discomfort with walking.   CURRENT MEDICATIONS:  1. Micardis 80 mg 1/2 tablet a day.  2. Lopressor 50 mg 1/2 tablet b.i.d.  3. Plavix 75 mg daily.  4. Aspirin 325 mg daily.  5. Vytorin 10/20 mg daily.  6. Nexium p.r.n.   ALLERGIES:  No known drug allergies.   PHYSICAL EXAMINATION:  He is a well-developed, well-nourished male in no  distress.  Blood pressure 151/84, pulse 67, weight 167.8 pounds.  HEENT:  Normal.  NECK:  Without JVD.  CARDIAC:  Normal S1 and S2.  Regular rate and rhythm without murmurs.  LUNGS:  Clear to auscultation bilaterally without wheezing, rales, or  rhonchi.  ABDOMEN:  Soft and nontender with normoactive bowel sounds.  No  organomegaly.  No bruits.  EXTREMITIES:  Without edema.  Femoral artery pulses are 2+ on the right, 1-2+ on the left.  Dorsalis  pedis and posterior tibial pulses are 1-2+ bilaterally.  I do not  appreciate any femoral artery bruits bilaterally.  NEUROLOGIC:  He is alert and oriented x3.  Cranial nerves II-XII are  grossly intact.   Electrocardiogram reveals a sinus rhythm with a heart rate of 62, normal  axis.  No ischemic changes.   IMPRESSION:  1. Left hip pain.      a.     Rule out claudication.  2. Coronary artery disease.      a.     Status post Taxus stent to the right coronary artery in       2005.      b.     Adenosine Cardiolite in June, 2008.  Low risk.  3. Cerebrovascular disease.  a.     Status post bilateral carotid endarterectomies.  4. History of abnormal chest CT in the past.  5. Dyslipidemia.  6. Hypertension.  7. Renal insufficiency.  8. Gastroesophageal reflux disease.  9. Ex-smoker.   PLAN:  Patient presents to the [facility] today for followup.  Overall, he  is doing well.  No further cardiovascular testing is warranted at this  time.  However, he does have some left hip pain that seems to be more  suggestive of musculoskeletal or neurologic etiology; however, he does  have some exertional left buttock pain.  Since he does have coronary  disease, I think it is important that we rule out peripheral arterial  disease.  At this point in time, we plan to:  1. Proceed with ABIs to screen for significant PAD.  2. He needs better blood pressure.  Will initiate HCTZ 12.5 mg a day.      Will get a follow-up BMET in 7-10 days.  3. He will follow up with Dr. Myrtis Horn in the next six months or sooner      p.r.n.      Tereso Newcomer, PA-C  Electronically Signed      Lucas Abed, MD, [facility]  Electronically Signed   SW/MedQ  DD: 12/17/2006  DT: 12/17/2006  Job #: [id number]   cc:   [name], M.D.

## 2010-08-16 NOTE — Assessment & Plan Note (Signed)
[facility]                          [facility]   NAME:Handyside, ERMIAS TOMEO                        MRN:          [id number]  DATE:10/01/2006                            DOB:          [birth year]    CARDIOLOGIST:  Dr. Joette Catching.   HISTORY OF PRESENT ILLNESS:  Mr. Bilodeau is a 75 year old male patient  with a history of coronary artery disease status post TAXUS drug-eluting  stent placement to the RCA in December 2005 who was recently seen at  [facility] for chest pain.  He ruled out for myocardial  infarction by enzymes.  He was treated with nitroglycerin, GI cocktail.  He saw Dr. Myrtis Ser back on May 20 and was set up for an outpatient  adenosine Cardiolite study.  The nuclear images raised the possibility  for very slight inferior ischemia.  No wall motion abnormalities were  noted.  Dr.  Myrtis Ser read the study and noted that this was a borderline  call but ischemia could not be ruled out.  The patient returns today for  followup.  He has had no further chest pain since we last saw him.  He  has had some dyspnea on exertion, this was a chronic symptom for him.  He denies any recent changes, denies any syncope or near syncope.  He  does have occasional dizziness that he describes as vertigo.  He denies  any orthopnea, paroxysmal nocturnal dyspnea.  Denies any pedal edema.  Denies any palpitations.   CURRENT MEDICATIONS:  1. Micardis 80 mg daily.  2. Lopressor 50 mg half tablet b.i.d.  3. Plavix 75 mg daily.  4. Aspirin 325 mg daily.  5. Vytorin 10/20 mg daily.  6. Nexium p.r.n.   ALLERGIES:  NO KNOWN DRUG ALLERGIES.   PHYSICAL EXAMINATION:  He is a well-nourished, well-developed male in no  distress.  Blood pressure 130/72, pulse 72, weight 167 pounds.  HEENT:  Normal.  NECK:  Without JVD at 90 degrees.  CARDIAC:  Normal S1-S2, regular rate and rhythm without murmurs.  LUNGS:  Clear to auscultation bilaterally without wheezing,  rhonchi, or  rales.  ABDOMEN:  Soft, nontender.  EXTREMITIES:  Without edema; calves soft, nontender.  SKIN:  Warm and dry.  NEUROLOGIC:  He is alert and oriented x3, cranial nerves II-XII grossly  intact.   IMPRESSION:  1. Coronary artery disease.      a.     Status post TAXUS stenting to the right coronary artery in       2005.      b.     Recent adenosine Cardiolite, low risk.  2. Cerebrovascular disease, status post bilateral carotid      endarterectomy.      a.     One side done December 2005, contralateral side done January       2007.  3. History of abnormal chest CT in the past.  4. Dyslipidemia.  5. Hypertension.  6. Renal insufficiency.  7. Gastroesophageal reflux disease.  8. Ex-smoker.   PLAN:  The patient presents to the office today for followup.  He did  have a very slight suggestion of inferior ischemia on his nuclear  images, however he is symptom-free at this point in time.  I discussed  this briefly over the telephone with Dr. Myrtis Ser.  At this point in time we  plan to continue medical management.  I would suggest if the patient has  any recurrent chest pain in the future that we consider the possibility  of proceeding with cardiac catheterization.  He does however have  significant gastroesophageal reflux disease.  He has dyspepsia at least  2-3 times per week and only takes Nexium p.r.n.  I have advised him to  go ahead and start taking Nexium on a daily basis.  We will bring him  back in followup with Dr. Myrtis Ser in the next 3 months.  He knows to  contact us sooner if he has any recurrent chest symptoms.      Tereso Newcomer, PA-C  Electronically Signed      Luis Abed, MD, [facility]  Electronically Signed   SW/MedQ  DD: 10/01/2006  DT: 10/01/2006  Job #: [id number]   cc:   Delaney Meigs, M.D.  Balinda Quails, M.D.

## 2010-08-16 NOTE — H&P (Signed)
[name], [name]                 ACCOUNT NO.:  [id number]   MEDICAL RECORD NO.:  [id number]          PATIENT TYPE:  INP   LOCATION:  3030                         FACILITY:  [facility]   PHYSICIAN:  Hilda Lias, M.D.   DATE OF BIRTH:  [birth year]   DATE OF ADMISSION:  11/06/2008  DATE OF DISCHARGE:                              HISTORY & PHYSICAL   Mr. Gann is a gentleman who had been seen by me in my office for more  than a year complaining of back pain with radiation to both legs,  difficulty with balance, tingling sensation in the lower extremity.  He  tells me that when he coughs the pain gets worse.  The patient has had  neurological disease with his spasticity.  He has been followed at [facility], and he has a diagnosis of upper motor neuron disease.  Nevertheless, he has spondylolisthesis at the level of L5-S1.  We have  had conservative treatment.  He is not any better.  He came to my office  lately, the last time about 10 days ago, telling me that he cannot live  with the pain.  He wanted to proceed with surgery.   PAST MEDICAL HISTORY:  Stent surgery to heart, carotid endarterectomy.   ALLERGIES:  He has no allergy to any medication.   SOCIAL HISTORY:  Negative.   FAMILY HISTORY:  Father died at the age of 25 with stroke.   REVIEW OF SYSTEMS:  Positive for high blood pressure, high cholesterol,  and back pain.   PHYSICAL EXAMINATION:  GENERAL:  The patient came to my office, he had  difficulty walking.  There is no question he was walking with spasticity  secondary to his upper motor neuron disease.  HEAD, EYES, EARS, NOSE, AND THROAT:  Normal.  NECK:  Normal.  LUNGS:  Clear.  HEART:  Heart sounds normal.  ABDOMEN:  Normal.  EXTREMITIES:  Normal.  NEUROLOGIC:  Mental status normal.  Cranial nerves normal.  He has  spasticity in the upper and lower extremities.  He has decreased  flexibility of the lumbar spine.  Straight leg raising, SLR is positive  about  60 degrees.   Lumbar spine x-ray showed that he has a spondylolisthesis at the level  of L5-1.   CLINICAL IMPRESSION:  1. L5-S1 spondylolisthesis.  2. Upper motor neuron disease.   RECOMMENDATIONS:  The patient is being admitted for surgery.  The  procedure will be L5-1 diskectomy.  We will follow up with a cage and  pedicle screws.  He knows about the risks such as infection, CSF leak,  worsening pain, and paralysis.  He and family are aware that this  procedure will not change his upper motor neuron disease.           ______________________________  Hilda Lias, M.D.     EB/MEDQ  D:  11/06/2008  T:  11/06/2008  Job:  [id number]

## 2010-08-19 NOTE — H&P (Signed)
[name], [name]                 ACCOUNT NO.:  [id number]   MEDICAL RECORD NO.:  [id number]          PATIENT TYPE:  INP   LOCATION:  NA                           FACILITY:  [facility]   PHYSICIAN:  Balinda Quails, M.D.    DATE OF BIRTH:  04/08/1935   DATE OF ADMISSION:  05/01/2005  DATE OF DISCHARGE:                                HISTORY & PHYSICAL   PRIMARY CARE PHYSICIAN:  Dr. Linna Darner.   CARDIOLOGIST:  Dr. Myrtis Ser.   CHIEF COMPLAINT:  Right internal carotid artery stenosis.   HISTORY OF PRESENT ILLNESS:  This is a 75 year old Caucasian male with past  medical history of extracranial cerebrovascular occlusive disease. In  December 2005, on carotid Doppler, the patient was found to have bilateral  internal carotid artery stenosis. In December 2005, the patient had a left  carotid endarterectomy and soon thereafter had PCI to the right internal  carotid artery. The patient has been seen for follow up since and in July  2006 the patient had a repeat carotid Doppler which revealed a high-grade  proximal right internal carotid artery stenosis of 80-90% and elevated  velocities. The patient did have an increase in velocity in the left carotid  bifurcation where he had previously undergone endarterectomy. His left  carotid showed a 60-79% stenosis. Left vertebral showed antegrade flow.  Right vertebral flow was not visible. The patient had recently undergone a  repeat Doppler evaluation. He had a severe right internal carotid artery  stenosis at 80-90%. His left carotid where he showed evidence of recurrent  stenosis had actually improved.   The patient does remain asymptomatic. He denies any headaches, nausea,  vomiting, vertigo, dizziness, seizures, numbness, tingling, muscle weakness,  dysphagia, vision changes, syncope, memory loss, TIA/CVA symptoms.   PAST MEDICAL HISTORY:  1.  Extracranial cerebrovascular occlusive disease.  2.  Hypertension.   PAST SURGICAL HISTORY:  1.  Left  carotid endarterectomy in 2005.  2.  PCI to right carotid in December of 2005.   ALLERGIES:  No known drug allergies.   MEDICATIONS:  1.  Micardis 80 milligrams p.o. daily.  2.  Lopressor 50 milligrams p.o. daily.  3.  Plavix 75 milligrams p.o. daily.  4.  Nexium 20 milligrams p.o. p.r.n.  5.  Aspirin 81 milligrams p.o. daily.   REVIEW OF SYSTEMS:  See HPI for pertinent positives and negative. Otherwise  for diabetes mellitus, weight loss, chest pain, shortness of breath. 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   SOCIAL HISTORY:  The patient is married with three children and he lives  with his family. He is retired. The patient denies any tobacco or alcohol  use.   FAMILY HISTORY:  Mother deceased at ae 20 due to cancer and coronary artery  disease. Father deceased at 25 due to coronary artery disease and cancer.   PHYSICAL EXAMINATION:  VITAL SIGNS:  Blood pressure 140/80, heart rate 76,  respirations 16.  GENERAL:  A 75 year old Caucasian male in no acute distress.  HEENT:  Normocephalic and atraumatic. Pupils are equal, round,  and reactive  to light and accommodation. Extraocular movements intact. Oral mucosa is  pink and moist. Sclerae nonicteric. The patient does have hearing aides  present bilaterally.  NECK:  Supple. A right carotid bruit is heard on exam. No left carotid  bruit.  RESPIRATIONS:  Clear to auscultation bilaterally and symmetrical on  inspiration.  CARDIOVASCULAR:  Regular rate and rhythm. No murmur, rub, or gallop.  ABDOMEN:  Soft, nontender, and nondistended. Normal active bowel sounds x1.  GENITOURINARY:  Deferred.  RECTAL:  Deferred.  EXTREMITIES:  No edema or venous stasis changes. Temperature is warm. The  patient has 2+ pulses throughout and bilaterally.  NEUROLOGICAL:  Nonfocal. Alert and oriented x3. Gait is steady. Cranial  nerves II through XII grossly intact. Muscle strength 5+ throughout  bilaterally.  Deep tendon reflexes are 2+ and symmetrical bilaterally.   ASSESSMENT:  1.  This 75 year old male with history of extracranial cerebrovascular      occlusive disease, status post a left carotid endarterectomy. He is      presenting for a right carotid endarterectomy.  2.  Hypertension.   PLAN:  1.  Admit the patient to [facility] on May 01, 2005 under Dr.      Madilyn Fireman' service.  2.  The patient will undergo a right carotid endarterectomy.  3.  Dr. Madilyn Fireman has seen the patient and evaluated him prior to admission and      explained the risks and benefits of the procedure in great detail. The      patient has agreed to continue.      Constance Holster, PA      P. Liliane Bade, M.D.  Electronically Signed    JMW/MEDQ  D:  04/27/2005  T:  04/28/2005  Job:  [id number]   cc:   Patient's chart   P. Liliane Bade, M.D.  [address]  [place]  [place] [address]   Linna Darner, M.D.   Willa Rough, M.D.  [address]  [address]  [place]  [place] [address]

## 2010-08-19 NOTE — Assessment & Plan Note (Signed)
[facility]                          [facility] OFFICE NOTE   NAME:[name], [name]                        MRN:          [id number]  DATE:03/09/2006                            DOB:          [birth year]    [name] is doing fine.  I saw him last on January 23, 2005.  Since  that time, he has had his right carotid endarterectomy by Dr. Madilyn Fireman and  he is doing very well.  He has not had any chest pain.  He has no  significant shortness of breath.  His drug eluting stent was placed in  December 2005.   PAST MEDICAL HISTORY:  Allergies:  No known drug allergies.  Medications:  1. Micardis 80.  2. Lopressor 25 b.i.d.  3. Plavix 75.  4. Aspirin 325.  5. Vytorin 10/20.  Other medical problems:  See the complete list below.   REVIEW OF SYSTEMS:  He is feeling well.  He has no significant  complaints.   PHYSICAL EXAMINATION:  VITAL SIGNS:  Weight 164 pounds, blood pressure  150/85.  He has not been on his medicines recently and we have refilled  them. His heart rate is 76.  GENERAL:  The patient is oriented to person, time, and place.  His  affect is normal.  HEENT:  No xanthelasma.  He has normal extraocular motion.  NECK:  His carotids are both nicely healed.  I do not hear any  significant bruits at this time.  There is no jugular venous distention.  LUNGS:  Clear.  Respiratory effort is not labored.  CARDIAC:  Exam reveals normal S1 and S2.  There are no clicks or  significant murmurs.  ABDOMEN:  Soft.  There are no masses or bruits.  EXTREMITIES:  He has no significant peripheral edema.   EKG reveals no significant changes.   PROBLEM LIST:  1. Coronary artery disease status post drug eluting stent to the right      coronary artery in December 2005.  He is now two years out.  It      would be reasonable for him to stop his Plavix and he and I      discussed this along with his son.  He has decided to remain on      Plavix.  It will be safe  for him to stop if and when he decides.  2. Peripheral vascular disease status post carotid endarterectomy in      December 2005 followed by the other side in January 2007.  3. History of abnormal CT scan of the chest in the past that does not      need to be repeated.  4. History of vasovagal episodes that are chronically stable.  5. Hyperlipidemia.  6. Hypertension, medicines restarted.  7. Creatinine 1.5.   [name] is doing well.  No further cardiac workup.  I will see him  back in one year.     [name], [name], [name]  Electronically Signed    JDK/MedQ  DD: 03/09/2006  DT: 03/09/2006  Job #: [id number]  cc:   Delaney Meigs, M.D.  Balinda Quails, M.D.

## 2010-08-19 NOTE — Op Note (Signed)
[name], [name]                 ACCOUNT NO.:  [id number]   MEDICAL RECORD NO.:  [id number]          PATIENT TYPE:  INP   LOCATION:  2550                         FACILITY:  [facility]   PHYSICIAN:  Balinda Quails, M.D.    DATE OF BIRTH:  04-11-1935   DATE OF PROCEDURE:  03/10/2004  DATE OF DISCHARGE:                                 OPERATIVE REPORT   PREOPERATIVE DIAGNOSIS:  Severe bilateral internal carotid artery stenosis.   POSTOPERATIVE DIAGNOSIS:  Severe bilateral internal carotid artery stenosis.   OPERATION PERFORMED:  Left carotid endarterectomy.   SURGEON:  Balinda Quails, M.D.   ASSISTANT:  Eber Jones A. Eustaquio Boyden.   ANESTHESIA:  General endotracheal.   ANESTHESIOLOGIST:  Zenon Mayo, MD   INDICATIONS FOR PROCEDURE:  [name] is a 75 year old male referred  from [facility] with a Doppler finding of severe bilateral  internal carotid artery stenosis.  He was seen in the office last week.  Following this, he underwent elective cardiac catheterization revealing a  significant midright coronary artery lesion.  Consultation carried out with  Dr. Riley Kill and myself, decision was made to pursue a left carotid  endarterectomy due to the high grade severe stenosis.  He is then to be  followed postoperatively with elective right coronary stenting.  Following  this, he will undergo right carotid endarterectomy on an elective basis.  His carotid disease is asymptomatic.   DESCRIPTION OF PROCEDURE:  The patient was brought to the operating room in  stable hemodynamic condition.  Placed under general endotracheal anesthesia.  The left neck prepped and draped in the sterile fashion.  Foley catheter and  arterial line in place.  A curvilinear skin incision was made along the  anterior border of the left sternomastoid muscle.  Dissection carried down  through subcutaneous tissue with electrocautery.  Deep dissection carried  down to the platysma.  The platysma  divided with electrocautery.  Further  deep dissection carried along the anterior border of the sternomastoid to  the carotid sheath.  The common carotid artery mobilized proximally down to  the omohyoid muscle and encircled with a vessel loop.  The vagus nerve  reflected posteriorly and preserved.  Carotid bifurcation exposed.  The  superior thyroid and external carotid artery were encircled with vessel  loops.  The internal carotid artery was then followed distally up to the  posterior belly of the digastric muscle and encircled with a vessel loop.  Hypoglossal nerve reflected superiorly and preserved. The carotid  bifurcation revealed plaque disease extending approximately 2 to 3 cm up  into the left internal carotid artery.  The patient was administered 7000  units of heparin intravenously.  Adequate circulation time permitted.  The  carotid vessels controlled with clamps.  A longitudinal arteriotomy made in  the distal left common carotid artery.  The arteriotomy extended across the  carotid bulb and up into the internal carotid artery.  There were tandem  high grade stenoses in the left internal carotid artery.  These were both  greater than 80%.  A shunt was then  inserted.  Plaque was removed with an  endarterectomy elevator.  Plaque raised up into the common carotid artery  where it was divided transversely with Potts scissors.  The external carotid  artery and superior thyroid were endarterectomized using an eversion  technique.  The distal internal carotid artery plaque then removed.  Fragments of plaque were removed with plaque forceps.  The site irrigated  with heparin saline solution.   A primary arteriotomy closure was carried out.  The internal carotid artery  was of good caliber.  Running 6-0 Prolene suture used to close the  arteriotomy.  The shunt then removed.  All vessels well flushed.  Clamps  were removed directing initial antegrade flow up the external carotid   artery, following this, the internal carotid was released.  Excellent pulse  and Doppler signal in the distal internal carotid artery.  Adequate  hemostasis obtained.  Sponge and instrument counts were correct.   Sternomastoid fascia closed with running 2-0 Vicryl suture.  Platysma closed  with running 3-0 Vicryl suture.  Skin closed with 4-0 Monocryl.  Steri-  Strips applied.  Patienit transferred to the recovery room in stable  condition.  There were no apparent complications.       PGH/MEDQ  D:  03/10/2004  T:  03/11/2004  Job:  [id number]   cc:   Willa Rough, M.D.   Dr. Regina Eck, [place]

## 2010-08-19 NOTE — Discharge Summary (Signed)
[name], [name]                 ACCOUNT NO.:  [id number]   MEDICAL RECORD NO.:  [id number]          PATIENT TYPE:  OIB   LOCATION:  2929                         FACILITY:  [facility]   PHYSICIAN:  Willa Rough, M.D.     DATE OF BIRTH:  1935-06-24   DATE OF ADMISSION:  03/23/2004  DATE OF DISCHARGE:  03/24/2004                                 DISCHARGE SUMMARY   DISCHARGE DIAGNOSES:  1.  Status post percutaneous coronary intervention to the right coronary      artery on March 23, 2004.  2.  Status post left carotid endarterectomy on March 10, 2004 by Dr. Liliane Bade.  3.  Future plans for right carotid endarterectomy in six months.  4.  Abnormal chest computerized tomography with pleural thickening, likely      benign, but needs follow up; this will be done by Dr. Myrtis Ser.  5.  Vasovagal episodes, not new, being followed by Dr. Myrtis Ser for this.   HOSPITAL COURSE:  The patient was discharged from [name] on March 15, 2004 status post cardiac catheterization, left carotid endarterectomy; and,  patient returns this admission for follow up percutaneous intervention.  This was done by Dr. Shawnie Pons.  The patient tolerated the procedures  without any complications; however, did have a vagal episode post PCI;  however, patient was without any complications from this.  Angiomax was DC  'd postprocedure.  Continued medications included Plavix, Micardis, aspirin,  Lipitor, Lopressor, and Protonix.   DISPOSITION:  The patient is being discharged to home on the following  medications.   DISCHARGE MEDICATIONS:  1.  Aspirin 325 mg daily.  2.  Plavix 75 daily.  3.  Micardis 300 daily.  4.  Lipitor 80 daily.  5.  Lopressor 25 p.o. b.i.d.  6.  Protonix 40 daily.  7.  Pain management and Tylenol for general discomfort.   ACTIVITY:  No driving times two days.  No lifting over 10 pounds times one  weeks.   DIET:  Heart Smart, low-fat, low-salt.   SPECIAL INSTRUCTIONS:  The  patient is to call our [facility] office for any fever,  any swelling or drainage from cath site.  He has a follow up appointment  scheduled with Dr. Myrtis Ser at our [facility] office for April 11, 2004 at 12 noon.   LABORATORY DATA:  Discharge lab work:  WBC 6.7, hemoglobin 12.3, hematocrit  34.6 and platelet count 344,000.  Chemistries:  Sodium 137, potassium 4.2,  un 14 and creatinine 1.2.       MB/MEDQ  D:  03/24/2004  T:  03/25/2004  Job:  [id number]   cc:   Dr. Linna Darner

## 2010-08-19 NOTE — Op Note (Signed)
NAMEHALDON, CARLEY                 ACCOUNT NO.:  [id number]   MEDICAL RECORD NO.:  [id number]          PATIENT TYPE:  INP   LOCATION:  2550                         FACILITY:  [facility]   PHYSICIAN:  Balinda Quails, M.D.    DATE OF BIRTH:  June 11, 1935   DATE OF PROCEDURE:  03/10/2004  DATE OF DISCHARGE:                                 OPERATIVE REPORT   PREOPERATIVE DIAGNOSIS:  Severe left internal carotid artery stenosis.   POSTOPERATIVE DIAGNOSIS:  Severe left internal carotid artery stenosis.   OPERATION PERFORMED:  Left carotid endarterectomy.   SURGEON:  Balinda Quails, M.D.   ASSISTANT:  Eber Jones A. Eustaquio Boyden.   ANESTHESIA:  General endotracheal.   ANESTHESIOLOGIST:  Zenon Mayo, MD   DICTATION ENDED HERE.       PGH/MEDQ  D:  03/10/2004  T:  03/11/2004  Job:  [id number]   cc:   Willa Rough, M.D.   Dr. Regina Eck, [place]

## 2010-08-19 NOTE — Discharge Summary (Signed)
[name], [name]                 ACCOUNT NO.:  [id number]   MEDICAL RECORD NO.:  [id number]          PATIENT TYPE:  INP   LOCATION:  A301                          FACILITY:  [facility]   PHYSICIAN:  Tilford Pillar, MD      DATE OF BIRTH:  15-Aug-1935   DATE OF ADMISSION:  04/28/2008  DATE OF DISCHARGE:  01/29/2010LH                               DISCHARGE SUMMARY   ADMISSION DIAGNOSIS:  Acute cholecystitis.   DISCHARGE DIAGNOSES:  1. Status post laparoscopic cholecystectomy for acute cholecystitis.  2. Coronary artery disease.  3. Gastroesophageal reflux disease.   PROCEDURES:  Laparoscopic cholecystectomy.   ADMITTING SURGEON:  Dr. Tilford Pillar.   DISPOSITION:  Home.   BRIEF H AND P:  Please see admission history and physical for complete H  and P.  The patient is a 75 year old male who presented with severe  right upper quadrant abdominal pain.  Based on his preop and emergency  room evaluation, it was suspected that the patient did have acute  cholecystitis.  He was admitted for a planned management and  intervention.   HOSPITAL COURSE:  The patient was admitted on April 28, 2008.  He  underwent at this time, he did have resuscitated with IV fluid and on  April 30, 2008, proceeded to the operating room for a laparoscopic  cholecystectomy for gangrenous cholecystitis.  His postoperative course  was unremarkable.  He continued to tolerate a regular diet on  postoperative day #2.  He was tolerating regular diet.  He is ambulatory  and plans were made for discharge to home.  The patient was discharged  on May 01, 2008.   DISCHARGE INSTRUCTIONS:  The patient was instructed to continue a low-  sodium and heart-healthy diet.  He is to continue to wash his wounds  with soap and water.  He is to continue to drain care as discussed.  He  is to increase his activity slowly.  He may walk up steps.  He is not to  lift anything greater than 20 pounds to the next 4 weeks.  He is not  to  drive while taking pain medications.  He is to return to see me in 1  week to reevaluate the drain.   DISCHARGE MEDICATIONS:  The patient is to resume all previous home  medications.   ADDITIONAL DISCHARGE MEDICATIONS:  Lortab 5/500 one to two q.4 h. p.r.n.  pain.      Tilford Pillar, MD  Electronically Signed     BZ/MEDQ  D:  06/01/2008  T:  06/01/2008  Job:  [id number]

## 2010-08-19 NOTE — Op Note (Signed)
[name], [name]                 ACCOUNT NO.:  [id number]   MEDICAL RECORD NO.:  [id number]          PATIENT TYPE:  INP   LOCATION:  2899                         FACILITY:  [facility]   PHYSICIAN:  Balinda Quails, M.D.    DATE OF BIRTH:  06-Jun-1935   DATE OF PROCEDURE:  05/01/2005  DATE OF DISCHARGE:                                 OPERATIVE REPORT   SURGEON:  Denman George, M.D.   ASSISTANT:  Quita Skye. Hart Rochester, M.D. and [place], [place].   ANESTHETIC:  General endotracheal.   PREOPERATIVE DIAGNOSIS:  Severe right internal carotid artery stenosis.   POSTOPERATIVE DIAGNOSIS:  Severe right internal carotid artery stenosis.   PROCEDURE:  Right carotid endarterectomy, Dacron patch angioplasty.   CLINICAL NOTE:  [name] is a 75 year old male with a history of  coronary disease and carotid occlusive disease. He has previously undergone  left carotid endarterectomy.  He is brought to the operating at this time  for elective right carotid endarterectomy for severe stenosis associated  with asymptomatic disease.   OPERATIVE PROCEDURE:  The patient was brought to the operating room in  stable condition. Placed in supine position. General endotracheal anesthesia  induced. Foley catheter arterial line in place. The right neck prepped and  draped in sterile fashion.   Curvilinear skin incision made along the anterior border of the right  sternomastoid muscle. Dissection carried through the subcutaneous tissue and  platysma with electrocautery. Deep dissection carried down to expose the  carotid sheath. The facial vein ligated with 3-0 silk and divided. The  carotid bifurcation exposed. The common carotid artery mobilized down to the  omohyoid muscle and encircled with the vessel loop. The vagus nerve  reflected posteriorly and preserved. The superior thyroid and external  carotid were freed and encircled with vessel loops. The internal carotid  artery followed distally up to the  posterior belly of the digastric muscle.  The hypoglossal nerve retracted superiorly. The distal internal carotid  artery encircled with a vessel loop.   Evaluation of the carotid bifurcation verified plaque disease extending from  the bulb into the right internal carotid artery.   The patient then administered 7000 units of heparin intravenously. Adequate  circulation time permitted. The carotid vessels controlled with clamps.  Longitudinal arteriotomy made in the distal common carotid artery. The  arteriotomy extended across the carotid bulb and up into the internal  carotid artery. There was an ulcerated plaque at the origin of the right  internal carotid artery with a 80% to 90% stenosis.  A shunt was inserted.   The plaque removed with an endarterectomy elevator. The endarterectomy  carried down in the common carotid artery where the plaque was divided  transversely with Potts scissors. The external carotid and superior thyroid  were endarterectomized using an eversion technique. The internal carotid  artery plaque then feathered out distally. Excellent end point obtained.  Remaining fragments of plaque removed with fine forceps. The site irrigated  with heparin and saline solution.   The patch angioplasty of the endarterectomy site was then carried out using  running 6-0 Prolene suture with a Finesse Dacron patch.  At completion of  the patch angioplasty, the shunt was removed. All vessels well flushed.  Clamps removed directing initial antegrade flow up the external carotid  artery, following this, the internal carotid was released.   There is an excellent pulse and Doppler signal in the distal internal  carotid artery.   Adequate hemostasis was obtained. The patient administered 50 mg of  protamine intravenously. Sponge and instrument counts correct.   The sternomastoid fascia closed with running 2-0 Vicryl suture. Platysma  closed with running 3-0 Vicryl suture. Skin  closed with 4-0 Monocryl. Steri-  Strips applied. Sterile dressing applied.   The patient tolerated procedure well. No apparent complications. Transferred  to the recovery in stable condition.      Balinda Quails, M.D.  Electronically Signed     PGH/MEDQ  D:  05/01/2005  T:  05/02/2005  Job:  [id number]   cc:   [name] Morton. Riley Kill, M.D. [facility]  [address]  [address]  [place]  [place] [address]

## 2010-08-19 NOTE — Discharge Summary (Signed)
Horn, Lucas                 ACCOUNT NO.:  [id number]   MEDICAL RECORD NO.:  [id number]          PATIENT TYPE:  INP   LOCATION:  2003                         FACILITY:  [facility]   PHYSICIAN:  [name] Morton. Riley Kill, M.D. [facility] BIRTH:  Feb 28, 1936   DATE OF ADMISSION:  03/08/2004  DATE OF DISCHARGE:  03/15/2004                                 DISCHARGE SUMMARY   PROCEDURES:  1.  Cardiac catheterization.  2.  Coronary arteriogram.  3.  Left ventriculogram.  4.  Left carotid endarterectomy.   HOSPITAL COURSE:  [name] is a 75 year old male with a history of  epigastric discomfort.  He has a family history of coronary artery disease  and was evaluated by Dr. Myrtis Ser in the [facility].  It was felt  that cardiology evaluation was needed with cardiac catheterization and  because of carotid bruits, carotid Dopplers were arranged as well.  He was  admitted to the hospital on March 08, 2004.   The cardiac catheterization showed a 90% RCA, but otherwise non-obstructive  disease.  The films were evaluated by Dr. Riley Kill and it was felt that  percutaneous intervention was indicated.  However, he also had significant  carotid artery stenosis and a CVTS consult was called.   [name] was evaluated by Dr. Madilyn Fireman, who felt that his carotid  endarterectomy should be performed first with percutaneous intervention to  follow.  He had surgery on March 10, 2004.   [name] tolerated the surgery well.  He was seen by cardiac  rehabilitation and ambulated without chest pain or shortness of breath.  There was concern for percutaneous intervention soon after surgery and  increased risk of bleeding.  The situation was reviewed by Dr. Riley Kill, Dr.  Myrtis Ser and Dr. Madilyn Fireman.  It was felt that his neck was healing well and since he  was ambulating without chest pain or shortness of breath, percutaneous  intervention could be deferred.  On March 15, 2004 vital signs were  stable.  He had  Lopressor and Lipitor as well as Protonix added to his  medication regimen.  An MRA of his head was done without contrast media and  this is to be reviewed by Dr. Madilyn Fireman.  [name] is considered stable for  discharge on March 15, 2004 with outpatient follow-up arranged.   DISCHARGE DIAGNOSES:  1.  Anginal pain - Follow-up for percutaneous intervention March 23, 2004.  2.  Carotid stenosis - Status post left carotid endarterectomy with elective      right carotid endarterectomy per Dr. Madilyn Fireman.  3.  Hypertension.  4.  Hyperlipidemia.  5.  Family history of coronary artery disease.  6.  Creatinine of 1.5 by history - Post catheterization labs showing BUN of      14, creatinine of 1.3.   DISCHARGE INSTRUCTIONS:  1.  His activity is to include no strenuous activity until after his      percutaneous intervention.  2.  He is to notify CVTS if he has any problems with his incision.  3.  He  is to return to the hospital on Wednesday, December the 21st, at 8:30      a.m. for a 10:30 a.m. case and he is to follow-up with Dr. Madilyn Fireman on      Monday, January the 9th, at 12:20 p.m.   DISCHARGE MEDICATIONS:  1.  Aspirin 81 mg q. day.  2.  Plavix 75 mg q. day.  3.  Micardis 80 mg q. day.  4.  Lipitor 80 mg q. day.  5.  Lopressor 50 mg one half tab b.i.d.  6.  Nexium 40 mg q. day.       RB/MEDQ  D:  03/15/2004  T:  03/15/2004  Job:  [id number]   cc:   Linna Darner, Dr.  [facility], [place], [place]   P. Liliane Bade, M.D.  [address]  [place]  [place] [address]

## 2010-08-19 NOTE — Cardiovascular Report (Signed)
NAMEARTURO, [name]                 ACCOUNT NO.:  [id number]   MEDICAL RECORD NO.:  [id number]          PATIENT TYPE:  OIB   LOCATION:  2929                         FACILITY:  [facility]   PHYSICIAN:  [name]. Riley Kill, M.D. [facility] OF BIRTH:  07-21-1935   DATE OF PROCEDURE:  03/23/2004  DATE OF DISCHARGE:  03/24/2004                              CARDIAC CATHETERIZATION   INDICATIONS:  The patient is a 75 year old gentleman who presented and was  found to have high grade stenoses of bilateral carotid arteries and high  grade stenosis of the right coronary artery.  He subsequently underwent  carotid endarterectomy.  The plan was to try to get him out at least a week  and then do percutaneous intervention of the right coronary.  He underwent  carotid endarterectomy by Dr. Madilyn Fireman and it was uncomplicated.  His scar has  healed nicely.  His laboratories are stable.  He is brought back today for  percutaneous intervention of the right coronary.  Risks, benefits, and  alternatives were discussed with the patient and his family.  They were  agreeable to proceed.   DESCRIPTION OF PROCEDURE:  The patient was brought to the catheterization  laboratory after informed consent.  Through an anterior puncture, the right  femoral artery was easily entered and a 6 French sheath was placed.  A JR-4  guiding catheter with side holes was utilized.  Bivalirudin was given  according to protocol.  ACT was checked and found to be appropriate.  The  lesion was crossed with a Hi-Torque Floppy wire and then dilated with a 20 x  2.25 Maverick balloon.  Predilatation was done. We then used a Taxus drug-  eluting stent 2.75 x 24 in part related to the vessel diameter and also  length.  This was taken up to 16 atmospheres with marked improvement in the  appearance of the artery.  There was a very small side branch in the middle  of the stenosis which was lost or with slow flow at the completion of the  procedure.  Post  dilatation was done with 3.25 mm Quantum Maverick balloon.  This resulted in excellent angiographic appearance.  Procedure was  completed.  Wire was removed.  Low dose of intracoronary nitroglycerin was  administered.  Final views obtained.  The patient taken to the holding area  in satisfactory clinical condition after removal of all catheters.   ANGIOGRAPHIC DATA:  The right coronary artery had 30-40% narrowing  proximally.  After intracoronary nitroglycerin, this was substantially  improved.  In the mid vessel is segmental disease of 70% with focal area of  90-95%, distally there is about 30% narrowing, and there is some mild  narrowing after the treatment site.  The mid PDA has a 70% stenosis which we  elected not to treat.  Following percutaneous stenting, the right coronary  artery went from 95 and diffuse 70 down to 0% with excellent angiographic  appearance.  This was no distal embolization.   CONCLUSION:  Successful percutaneous stenting of the right coronary artery.   DISPOSITION:  The patient will need  to stay on aspirin and Plavix.  Followup  will be with Dr. Myrtis Ser.  He will also followup with Dr. Madilyn Fireman and Dr. Linna Darner.       TDS/MEDQ  D:  03/23/2004  T:  03/24/2004  Job:  [id number]   cc:   Willa Rough, M.D.   Dr. Payton Mccallum in [place]

## 2010-08-19 NOTE — Discharge Summary (Signed)
[name], [name]                 ACCOUNT NO.:  [id number]   MEDICAL RECORD NO.:  [id number]          PATIENT TYPE:  INP   LOCATION:  3301                         FACILITY:  [facility]   PHYSICIAN:  Balinda Quails, M.D.    DATE OF BIRTH:  1935/04/10   DATE OF ADMISSION:  05/01/2005  DATE OF DISCHARGE:  05/02/2005                                 DISCHARGE SUMMARY   HISTORY OF PRESENT ILLNESS:  The patient is a 75 year old white male with a  past medical history of extracranial cerebrovascular occlusive disease.  He  has previously undergone left carotid endarterectomy in December 2005.  During recent follow up in July 2006, the patient had a high grade proximal  right ICA stenosis 80-99% with elevated velocities.  The patient has  remained asymptomatic.  He is admitted this hospitalization for elective  right carotid endarterectomy.   PAST MEDICAL HISTORY:  1.  Extracranial cerebrovascular occlusive disease.  2.  Hypertension.   PAST SURGICAL HISTORY:  1.  Left carotid endarterectomy in 2005.  2.  Percutaneous intervention of the right carotid in 2005.   ALLERGIES:  No known drug allergies.   MEDICATIONS PRIOR TO ADMISSION:  Micardis 80 mg daily, Lopressor 50 mg  daily, Plavix 75 mg daily, Nexium p.r.n., aspirin 81 mg daily.   For family history, social history, review of systems, and physical exam,  please see the history and physical done at the time of admission.   HOSPITAL COURSE:  The patient was admitted May 01, 2005, and taken to  the operating room at which time he underwent a right carotid endarterectomy  with Dacron patch angioplasty.  The patient tolerated the procedure well and  was taken to the post anesthesia care unit in stable condition.  Postoperatively, the patient has done well.  He is neurologically intact  without focal deficits.  His incision is healing well without evidence of  hematoma, bleeding, or infection.  He has responded well in regards to  routine postoperative protocols of increasing activity.  Laboratory values  are stable with a mild hemoglobin and hematocrit decrease to 12 and 33  respectively.   DISCHARGE MEDICATIONS:  The same as preoperatively.  Additionally, he is to  restart his Plavix 75 mg daily.  For pain, Tylox 1-2 q.6h. p.r.n. pain.   DISCHARGE INSTRUCTIONS:  The patient received written instructions regarding  medications, activity, diet, wound care, and follow up.  Follow up included  Dr. Madilyn Fireman in two weeks and the office will call with appointment.   CONDITION ON DISCHARGE:  Stable and improved.   FINAL DIAGNOSIS:  Asymptomatic, severe right internal carotid artery  stenosis now status post carotid endarterectomy.      Rowe Clack, P.A.-C.      Balinda Quails, M.D.  Electronically Signed    WEG/MEDQ  D:  05/02/2005  T:  05/02/2005  Job:  [id number]

## 2010-08-19 NOTE — Cardiovascular Report (Signed)
[name], [name]                 ACCOUNT NO.:  [id number]   MEDICAL RECORD NO.:  [id number]          PATIENT TYPE:  OUT   LOCATION:  CATS                         FACILITY:  [facility]   PHYSICIAN:  [name] Morton. Riley Kill, M.D. [facility] OF BIRTH:  1935-04-24   DATE OF PROCEDURE:  09/15/2004  DATE OF DISCHARGE:                              CARDIAC CATHETERIZATION   INDICATIONS:  A 75 year old with carotid disease and evaluation.   PROCEDURES:  1.  Left heart catheterization.  2.  Selective coronary arteriography.  3.  Selective left ventriculography.   DESCRIPTION OF PROCEDURE:  The procedure was performed from the right  femoral artery using 4-French catheters.  The patient tolerated the  procedure well.  There are no complications.  He was taken to the holding  area in satisfactory clinical condition.   HEMODYNAMIC DATA.:  1.  Central aortic pressure 118/71, mean 91.  2.  Left atrial pressure 112/10.  3.  No gradient on pullback across the aortic valve.   ANGIOGRAPHIC DATA.:  1.  Ventriculography was performed in the RAO projection.  Overall systolic      function is preserved.  No segmental abnormalities of contraction were      identified.  2.  There is mild calcification at the distal aspect of left main and      involving the proximal left anterior descending artery.  3.  The left main itself is free of critical disease.  4.  The left anterior descending artery courses to the apex.  In the      proximal vessel was an eccentric area of calcified plaquing that      measures about 30% luminal reduction.  There are several diagonal      branches which have mild luminal irregularity but no evidence of high-      grade focal disease.  The distal LAD goes to that the apical tip and is      not critically narrowed.  5.  The circumflex coronary artery provides three smaller-caliber marginal      branches which come off proximally.  Following this, there is a fourth      marginal branch  which has about 40-50% area of eccentric plaquing      leading into a bifurcating fourth marginal branch.  The AV circumflex      itself is small.  6.  The right coronary artery is a dominant vessel providing a posterior      descending and a moderate-size posterolateral system.  The right      coronary artery demonstrates about 30% narrowing in the proximal vessel.      After this there is an area of segmental plaquing followed by a 90%      focal area of stenosis in the midportion of the vessel.  There is mild      eccentric plaquing prior to the bifurcation of the PDA and AV portion of      the right coronary, and then there is a mid-PDA stenosis that measures      about 50-60% in eccentric luminal  reduction.   CONCLUSION:  1.  Well-preserved left ventricular function.  2.  High-grade stenosis of the mid-right coronary artery with other      scattered abnormalities in the left anterior descending and circumflex      as described above.   DISPOSITION:  The case will need to be discussed with vascular surgery as to  optimal approach.       TDS/MEDQ  D:  09/15/2004  T:  09/15/2004  Job:  [id number]   cc:   Salvadore Farber, M.D. [facility]  [address]  [address]  [place]  [place] [address]   Willa Rough, M.D.

## 2010-10-18 ENCOUNTER — Ambulatory Visit: Payer: Medicare Other | Attending: Psychiatry | Admitting: Physical Therapy

## 2010-10-18 DIAGNOSIS — R269 Unspecified abnormalities of gait and mobility: Secondary | ICD-10-CM | POA: Insufficient documentation

## 2010-10-18 DIAGNOSIS — R262 Difficulty in walking, not elsewhere classified: Secondary | ICD-10-CM | POA: Insufficient documentation

## 2010-10-18 DIAGNOSIS — R5381 Other malaise: Secondary | ICD-10-CM | POA: Insufficient documentation

## 2010-10-18 DIAGNOSIS — IMO0001 Reserved for inherently not codable concepts without codable children: Secondary | ICD-10-CM | POA: Insufficient documentation

## 2010-10-20 ENCOUNTER — Ambulatory Visit: Payer: Medicare Other | Admitting: *Deleted

## 2010-10-25 ENCOUNTER — Ambulatory Visit: Payer: Medicare Other | Admitting: *Deleted

## 2010-10-27 ENCOUNTER — Ambulatory Visit: Payer: Medicare Other | Admitting: Physical Therapy

## 2010-11-01 ENCOUNTER — Ambulatory Visit: Payer: Medicare Other | Attending: Psychiatry | Admitting: *Deleted

## 2010-11-01 DIAGNOSIS — R5381 Other malaise: Secondary | ICD-10-CM | POA: Insufficient documentation

## 2010-11-01 DIAGNOSIS — IMO0001 Reserved for inherently not codable concepts without codable children: Secondary | ICD-10-CM | POA: Insufficient documentation

## 2010-11-01 DIAGNOSIS — R262 Difficulty in walking, not elsewhere classified: Secondary | ICD-10-CM | POA: Insufficient documentation

## 2010-11-01 DIAGNOSIS — R269 Unspecified abnormalities of gait and mobility: Secondary | ICD-10-CM | POA: Insufficient documentation

## 2010-11-03 ENCOUNTER — Encounter: Payer: Medicare Other | Admitting: *Deleted

## 2010-12-06 ENCOUNTER — Emergency Department (HOSPITAL_COMMUNITY): Payer: Medicare Other

## 2010-12-06 ENCOUNTER — Emergency Department (HOSPITAL_COMMUNITY)
Admission: EM | Admit: 2010-12-06 | Discharge: 2010-12-07 | Disposition: A | Discharge status: Home / Self Care | Payer: Medicare Other | Attending: Emergency Medicine | Admitting: Emergency Medicine

## 2010-12-06 DIAGNOSIS — I2510 Atherosclerotic heart disease of native coronary artery without angina pectoris: Secondary | ICD-10-CM | POA: Insufficient documentation

## 2010-12-06 DIAGNOSIS — I10 Essential (primary) hypertension: Secondary | ICD-10-CM | POA: Insufficient documentation

## 2010-12-06 DIAGNOSIS — R109 Unspecified abdominal pain: Secondary | ICD-10-CM | POA: Insufficient documentation

## 2010-12-06 DIAGNOSIS — R079 Chest pain, unspecified: Secondary | ICD-10-CM | POA: Insufficient documentation

## 2010-12-06 DIAGNOSIS — J438 Other emphysema: Secondary | ICD-10-CM | POA: Insufficient documentation

## 2010-12-06 LAB — COMPREHENSIVE METABOLIC PANEL
AST: 209 U/L — ABNORMAL HIGH (ref 0–37)
CO2: 28 mEq/L (ref 19–32)
Calcium: 9.6 mg/dL (ref 8.4–10.5)
Creatinine, Ser: 1.18 mg/dL (ref 0.50–1.35)
GFR calc non Af Amer: >60 mL/min (ref >60)
Sodium: 143 mEq/L (ref 135–145)
Total Protein: 7.3 g/dL (ref 6.0–8.3)

## 2010-12-06 LAB — URINALYSIS, ROUTINE W REFLEX MICROSCOPIC
Ketones, ur: NEGATIVE mg/dL
Leukocytes, UA: NEGATIVE
Nitrite: NEGATIVE
Protein, ur: NEGATIVE mg/dL
pH: 6.5 (ref 5.0–8.0)

## 2010-12-06 LAB — CBC
HCT: 41.6 % (ref 39.0–52.0)
Hemoglobin: 14.6 g/dL (ref 13.0–17.0)
MCH: 30.7 pg (ref 26.0–34.0)
MCHC: 35.1 g/dL (ref 30.0–36.0)
MCV: 87.4 fL (ref 78.0–100.0)
Platelets: 197 10*3/uL (ref 150–400)
RBC: 4.76 MIL/uL (ref 4.22–5.81)
RDW: 13.3 % (ref 11.5–15.5)
WBC: 7.9 10*3/uL (ref 4.0–10.5)

## 2010-12-06 LAB — DIFFERENTIAL
Basophils Absolute: 0 10*3/uL (ref 0.0–0.1)
Basophils Relative: 0 % (ref 0–1)
Monocytes Absolute: 0.5 10*3/uL (ref 0.1–1.0)
Neutro Abs: 6.7 10*3/uL (ref 1.7–7.7)
Neutrophils Relative %: 84 % — ABNORMAL HIGH (ref 43–77)

## 2010-12-06 LAB — POCT I-STAT TROPONIN I: Troponin i, poc: 0 ng/mL (ref 0.00–0.08)

## 2010-12-06 LAB — D-DIMER, QUANTITATIVE: D-Dimer, Quant: 0.58 ug/mL-FEU — ABNORMAL HIGH (ref 0.00–0.48)

## 2010-12-07 LAB — POCT I-STAT TROPONIN I: Troponin i, poc: 0 ng/mL (ref 0.00–0.08)

## 2010-12-07 MED ORDER — IOHEXOL 300 MG/ML  SOLN
100.0000 mL | Freq: Once | INTRAMUSCULAR | Status: AC | PRN
  Administered 2010-12-07: 100 mL via INTRAVENOUS

## 2010-12-23 LAB — CBC
HCT: 41.7
Hemoglobin: 14.1
MCV: 88.2
RBC: 4.72
WBC: 7.3

## 2010-12-23 LAB — BASIC METABOLIC PANEL
Chloride: 102
GFR calc Af Amer: >60
Potassium: 3.6

## 2010-12-23 LAB — DIFFERENTIAL
Eosinophils Absolute: 0.3
Eosinophils Relative: 4
Lymphs Abs: 0.8
Monocytes Absolute: 0.3
Monocytes Relative: 5

## 2010-12-23 LAB — URINALYSIS, ROUTINE W REFLEX MICROSCOPIC
Glucose, UA: NEGATIVE
Hgb urine dipstick: NEGATIVE
Ketones, ur: NEGATIVE
Protein, ur: NEGATIVE

## 2010-12-23 LAB — POCT CARDIAC MARKERS: Troponin i, poc: <0.05

## 2010-12-28 LAB — DIFFERENTIAL
Basophils Absolute: 0
Basophils Relative: 0
Eosinophils Relative: 1
Monocytes Absolute: 0.4
Neutro Abs: 8.9 — ABNORMAL HIGH

## 2010-12-28 LAB — CBC
Hemoglobin: 14.7
MCHC: 35.3
RDW: 13.4

## 2010-12-28 LAB — BASIC METABOLIC PANEL
CO2: 29
Calcium: 9.8
Glucose, Bld: 126 — ABNORMAL HIGH
Sodium: 137

## 2011-01-03 LAB — CBC
Platelets: 218
WBC: 6

## 2011-01-03 LAB — DIFFERENTIAL
Lymphocytes Relative: 11 — ABNORMAL LOW
Lymphs Abs: 0.6 — ABNORMAL LOW
Neutro Abs: 4.8
Neutrophils Relative %: 80 — ABNORMAL HIGH

## 2011-01-03 LAB — BASIC METABOLIC PANEL
BUN: 12
Calcium: 9.8
Creatinine, Ser: 1.31
GFR calc non Af Amer: 54 — ABNORMAL LOW
Potassium: 4.7

## 2011-01-03 LAB — POCT CARDIAC MARKERS
CKMB, poc: 1.1
Myoglobin, poc: 161

## 2011-10-27 ENCOUNTER — Encounter: Payer: Self-pay | Admitting: Cardiology

## 2012-10-26 ENCOUNTER — Emergency Department (HOSPITAL_COMMUNITY)
Admission: EM | Admit: 2012-10-26 | Discharge: 2012-10-26 | Disposition: A | Discharge status: Home / Self Care | Payer: Medicare Other | Attending: Emergency Medicine | Admitting: Emergency Medicine

## 2012-10-26 ENCOUNTER — Encounter (HOSPITAL_COMMUNITY): Payer: Self-pay | Admitting: Emergency Medicine

## 2012-10-26 DIAGNOSIS — I2510 Atherosclerotic heart disease of native coronary artery without angina pectoris: Secondary | ICD-10-CM | POA: Insufficient documentation

## 2012-10-26 DIAGNOSIS — Z7982 Long term (current) use of aspirin: Secondary | ICD-10-CM | POA: Insufficient documentation

## 2012-10-26 DIAGNOSIS — I10 Essential (primary) hypertension: Secondary | ICD-10-CM | POA: Insufficient documentation

## 2012-10-26 DIAGNOSIS — Z87891 Personal history of nicotine dependence: Secondary | ICD-10-CM | POA: Insufficient documentation

## 2012-10-26 DIAGNOSIS — K219 Gastro-esophageal reflux disease without esophagitis: Secondary | ICD-10-CM | POA: Insufficient documentation

## 2012-10-26 DIAGNOSIS — Z79899 Other long term (current) drug therapy: Secondary | ICD-10-CM | POA: Insufficient documentation

## 2012-10-26 DIAGNOSIS — L299 Pruritus, unspecified: Secondary | ICD-10-CM

## 2012-10-26 DIAGNOSIS — Z8679 Personal history of other diseases of the circulatory system: Secondary | ICD-10-CM | POA: Insufficient documentation

## 2012-10-26 MED ORDER — DIPHENHYDRAMINE HCL 50 MG/ML IJ SOLN
25.0000 mg | Freq: Once | INTRAMUSCULAR | Status: AC
  Administered 2012-10-26: 25 mg via INTRAMUSCULAR
  Filled 2012-10-26: qty 1

## 2012-10-26 MED ORDER — FAMOTIDINE 20 MG PO TABS: 20.0000 mg | ORAL_TABLET | Freq: Two times a day (BID) | ORAL | Status: DC

## 2012-10-26 MED ORDER — HYDROXYZINE HCL 25 MG PO TABS: 25.0000 mg | ORAL_TABLET | Freq: Four times a day (QID) | ORAL | Status: DC | PRN

## 2012-10-26 MED ORDER — HYDROXYZINE HCL 25 MG PO TABS
25.0000 mg | ORAL_TABLET | Freq: Once | ORAL | Status: AC
  Administered 2012-10-26: 25 mg via ORAL
  Filled 2012-10-26: qty 1

## 2012-10-26 MED ORDER — FAMOTIDINE 20 MG PO TABS
20.0000 mg | ORAL_TABLET | Freq: Once | ORAL | Status: AC
  Administered 2012-10-26: 20 mg via ORAL
  Filled 2012-10-26: qty 1

## 2012-10-26 MED ORDER — METHYLPREDNISOLONE SODIUM SUCC 125 MG IJ SOLR
125.0000 mg | Freq: Once | INTRAMUSCULAR | Status: AC
  Administered 2012-10-26: 125 mg via INTRAMUSCULAR
  Filled 2012-10-26: qty 2

## 2012-10-26 MED ORDER — PREDNISONE 20 MG PO TABS: ORAL_TABLET | ORAL | Status: DC

## 2012-10-26 NOTE — ED Notes (Signed)
Pt presents with c/o itching on upper body, chest and arms since 1630 today. Pt states was in bed, had taken a pain pill due to chronic back pain when the rash began.  Pt denies SOB, changing soaps, both laundry and body , and denies eating new food type. Reports itching is worsening as it rash. Rash is a fine raised red with purpura looking rash. NAD noted at this time.

## 2012-10-26 NOTE — ED Notes (Signed)
Pt in wheelchair in triage, complaining of itching under arms and around neck. States his ear are burning.

## 2012-10-26 NOTE — ED Provider Notes (Signed)
CSN: [id number]     Arrival date & time 10/26/12  2132 History  This chart was scribed for Ward Givens, MD, by Yevette Edwards, ED Scribe. This patient was seen in room APA06/APA06 and the patient's care was started at 10:03 PM.   First MD Initiated Contact with Patient 10/26/12 2202     Chief Complaint  Patient presents with  . Pruritis   The history is provided by the patient and a relative. No language interpreter was used.   HPI Comments: [name] is a 77 y.o. male who presents to the Emergency Department complaining of sudden-onset, constant pruritus which began today around 4:30 pm while he was lying in bed. The pt had taken a hydrocodone at 1 pm today before he laid down. The pt states that the itching occurs to his torso, arms, and back. The pt denies any itching to his face and his legs. He denies swelling to his tongue, mouth, or throat. He also denies SOB. He denies a h/o of similar symptoms. The pt states he changed his laundry detergent several weeks ago. The pt's son denies any new foods. The pt denies any exposure to an environment where he could have been bit or had contact with poisonous foilage.  The pt attempted to alleviate the itching with [place] and a cold cloth; the pt's son states that the cold cloth helped initially, but the pt removed it because he was feeling cold.   Dr. Lysbeth Galas is PCP.    Past Medical History  Diagnosis Date  . Syncope     in the past thought possibly vasovagal. no palpitations at this time  . SVT (supraventricular tachycardia)     documented with a rate of 170 BPM 11/09 adenosine treatment converted to regular rhythm patient placed on diltiazem  . Coronary artery disease     taxus stent to the right coronary artery 2005 low risk cardiolite 2008  . Hypertension   . GERD (gastroesophageal reflux disease)    Past Surgical History  Procedure Laterality Date  . Baclofen pump refill  02/2010  . Angioplasty / stenting femoral  2005   No  family history on file. History  Substance Use Topics  . Smoking status: Former Games developer  . Smokeless tobacco: Not on file  . Alcohol Use: No  lives at home Lives with his son  Review of Systems  HENT: Negative for sore throat.   Respiratory: Negative for shortness of breath.   Skin:       Pruritis to torso, back, and arms.   All other systems reviewed and are negative.    Allergies  Review of patient's allergies indicates no known allergies.  Home Medications   Current Outpatient Rx  Name  Route  Sig  Dispense  Refill  . aspirin EC 81 MG tablet   Oral   Take 81 mg by mouth daily.         Marland Kitchen diltiazem (CARDIZEM CD) 240 MG 24 hr capsule   Oral   Take 240 mg by mouth daily.         Marland Kitchen esomeprazole (NEXIUM) 40 MG capsule   Oral   Take 40 mg by mouth daily before breakfast.         . fish oil-omega-3 fatty acids 1000 MG capsule   Oral   Take 2 g by mouth daily.         Marland Kitchen HYDROcodone-acetaminophen (NORCO/VICODIN) 5-325 MG per tablet   Oral   Take  1 tablet by mouth every 6 (six) hours as needed for pain.         Marland Kitchen lisinopril (PRINIVIL,ZESTRIL) 5 MG tablet   Oral   Take 5 mg by mouth daily.         . meloxicam (MOBIC) 7.5 MG tablet   Oral   Take 7.5 mg by mouth daily.         . Multiple Vitamin (MULTIVITAMIN WITH MINERALS) TABS   Oral   Take 0.5 tablets by mouth daily.         Marland Kitchen ROPINIROLE HCL PO   Oral   Take 1 tablet by mouth at bedtime.         . rosuvastatin (CRESTOR) 20 MG tablet   Oral   Take 5 mg by mouth daily.          Triage Vitals: BP 148/76  Pulse 87  Temp(Src) 98.7 F (37.1 C) (Oral)  Resp 18  Ht 5\' 7"  (1.702 m)  Wt 157 lb (71.215 kg)  BMI 24.58 kg/m2  Vital signs normal    Physical Exam  Nursing note and vitals reviewed. Constitutional: He is oriented to person, place, and time. He appears well-developed and well-nourished.  Non-toxic appearance. He does not appear ill. No distress.  HENT:  Head: Normocephalic  and atraumatic.  Right Ear: External ear normal.  Left Ear: External ear normal.  Nose: Nose normal. No mucosal edema or rhinorrhea.  Mouth/Throat: Oropharynx is clear and moist and mucous membranes are normal. No dental abscesses or edematous.  Eyes: Conjunctivae and EOM are normal. Pupils are equal, round, and reactive to light.  Neck: Normal range of motion and full passive range of motion without pain. Neck supple.  Cardiovascular: Normal rate, regular rhythm and normal heart sounds.  Exam reveals no gallop and no friction rub.   No murmur heard. Pulmonary/Chest: Effort normal and breath sounds normal. No respiratory distress. He has no wheezes. He has no rhonchi. He has no rales. He exhibits no tenderness and no crepitus.  Abdominal: Soft. Normal appearance and bowel sounds are normal. He exhibits no distension. There is no tenderness. There is no rebound and no guarding.  Musculoskeletal: Normal range of motion. He exhibits no edema and no tenderness.  Moves all extremities well.   Neurological: He is alert and oriented to person, place, and time. He has normal strength. No cranial nerve deficit.  Skin: Skin is warm, dry and intact. No rash noted. No erythema. No pallor.  Diffuse redness, no discrete urticarial lesions. Linear bruising from where he has been scratching himself.   Psychiatric: He has a normal mood and affect. His speech is normal and behavior is normal. His mood appears not anxious.    ED Course   Medications  diphenhydrAMINE (BENADRYL) injection 25 mg (25 mg Intramuscular Given 10/26/12 2228)  famotidine (PEPCID) tablet 20 mg (20 mg Oral Given 10/26/12 2227)  methylPREDNISolone sodium succinate (SOLU-MEDROL) 125 mg/2 mL injection 125 mg (125 mg Intramuscular Given 10/26/12 2228)  hydrOXYzine (ATARAX/VISTARIL) tablet 25 mg (25 mg Oral Given 10/26/12 2227)    Procedures (including critical care time)    COORDINATION OF CARE:  22:10 PM-Discussed treatment plan with  patient, and the patient agreed to the plan.   23:20 pt is feeling better with medications given. Ready to go home.   1. Pruritus     New Prescriptions   FAMOTIDINE (PEPCID) 20 MG TABLET    Take 1 tablet (20 mg total) by mouth 2 (two)  times daily.   HYDROXYZINE (ATARAX/VISTARIL) 25 MG TABLET    Take 1 tablet (25 mg total) by mouth every 6 (six) hours as needed for itching.   PREDNISONE (DELTASONE) 20 MG TABLET    Take 3 po QD x 2d starting tomorrow, then 2 po QD x 3d then 1 po QD x 3d    Plan discharge   Devoria Albe, MD, FACEP    MDM    I personally performed the services described in this documentation, which was scribed in my presence. The recorded information has been reviewed and considered.  Devoria Albe, MD, Armando Gang     Ward Givens, MD 10/26/12 2322

## 2012-10-26 NOTE — ED Notes (Signed)
Patient states that he is feeling better, that the medication helped, and that he is ready to go home.

## 2012-12-24 ENCOUNTER — Encounter (HOSPITAL_COMMUNITY): Payer: Self-pay

## 2012-12-24 ENCOUNTER — Emergency Department (HOSPITAL_COMMUNITY): Payer: Medicare Other

## 2012-12-24 ENCOUNTER — Inpatient Hospital Stay (HOSPITAL_COMMUNITY)
Admission: EM | Admit: 2012-12-24 | Discharge: 2012-12-27 | DRG: 444 | Disposition: A | Discharge status: Home / Self Care | Payer: Medicare Other | Attending: Internal Medicine | Admitting: Internal Medicine

## 2012-12-24 DIAGNOSIS — D649 Anemia, unspecified: Secondary | ICD-10-CM | POA: Diagnosis present

## 2012-12-24 DIAGNOSIS — R17 Unspecified jaundice: Secondary | ICD-10-CM | POA: Diagnosis present

## 2012-12-24 DIAGNOSIS — I498 Other specified cardiac arrhythmias: Secondary | ICD-10-CM | POA: Diagnosis present

## 2012-12-24 DIAGNOSIS — K838 Other specified diseases of biliary tract: Secondary | ICD-10-CM

## 2012-12-24 DIAGNOSIS — Z981 Arthrodesis status: Secondary | ICD-10-CM

## 2012-12-24 DIAGNOSIS — K8309 Other cholangitis: Principal | ICD-10-CM | POA: Diagnosis present

## 2012-12-24 DIAGNOSIS — Z9089 Acquired absence of other organs: Secondary | ICD-10-CM

## 2012-12-24 DIAGNOSIS — K5790 Diverticulosis of intestine, part unspecified, without perforation or abscess without bleeding: Secondary | ICD-10-CM | POA: Diagnosis present

## 2012-12-24 DIAGNOSIS — K219 Gastro-esophageal reflux disease without esophagitis: Secondary | ICD-10-CM | POA: Diagnosis present

## 2012-12-24 DIAGNOSIS — K4090 Unilateral inguinal hernia, without obstruction or gangrene, not specified as recurrent: Secondary | ICD-10-CM | POA: Diagnosis present

## 2012-12-24 DIAGNOSIS — D72829 Elevated white blood cell count, unspecified: Secondary | ICD-10-CM | POA: Diagnosis present

## 2012-12-24 DIAGNOSIS — D638 Anemia in other chronic diseases classified elsewhere: Secondary | ICD-10-CM | POA: Diagnosis present

## 2012-12-24 DIAGNOSIS — Z8249 Family history of ischemic heart disease and other diseases of the circulatory system: Secondary | ICD-10-CM

## 2012-12-24 DIAGNOSIS — E876 Hypokalemia: Secondary | ICD-10-CM | POA: Diagnosis not present

## 2012-12-24 DIAGNOSIS — E871 Hypo-osmolality and hyponatremia: Secondary | ICD-10-CM | POA: Diagnosis present

## 2012-12-24 DIAGNOSIS — Q762 Congenital spondylolisthesis: Secondary | ICD-10-CM

## 2012-12-24 DIAGNOSIS — R109 Unspecified abdominal pain: Secondary | ICD-10-CM

## 2012-12-24 DIAGNOSIS — J449 Chronic obstructive pulmonary disease, unspecified: Secondary | ICD-10-CM | POA: Diagnosis present

## 2012-12-24 DIAGNOSIS — K859 Acute pancreatitis without necrosis or infection, unspecified: Secondary | ICD-10-CM | POA: Diagnosis present

## 2012-12-24 DIAGNOSIS — K209 Esophagitis, unspecified without bleeding: Secondary | ICD-10-CM | POA: Diagnosis present

## 2012-12-24 DIAGNOSIS — I471 Supraventricular tachycardia, unspecified: Secondary | ICD-10-CM | POA: Diagnosis not present

## 2012-12-24 DIAGNOSIS — K2100 Gastro-esophageal reflux disease with esophagitis, without bleeding: Secondary | ICD-10-CM | POA: Diagnosis present

## 2012-12-24 DIAGNOSIS — J4489 Other specified chronic obstructive pulmonary disease: Secondary | ICD-10-CM | POA: Diagnosis present

## 2012-12-24 DIAGNOSIS — R042 Hemoptysis: Secondary | ICD-10-CM | POA: Diagnosis not present

## 2012-12-24 DIAGNOSIS — G1223 Primary lateral sclerosis: Secondary | ICD-10-CM | POA: Diagnosis present

## 2012-12-24 DIAGNOSIS — Z87891 Personal history of nicotine dependence: Secondary | ICD-10-CM

## 2012-12-24 DIAGNOSIS — M4317 Spondylolisthesis, lumbosacral region: Secondary | ICD-10-CM

## 2012-12-24 DIAGNOSIS — Z7982 Long term (current) use of aspirin: Secondary | ICD-10-CM

## 2012-12-24 DIAGNOSIS — I10 Essential (primary) hypertension: Secondary | ICD-10-CM | POA: Diagnosis present

## 2012-12-24 DIAGNOSIS — I2510 Atherosclerotic heart disease of native coronary artery without angina pectoris: Secondary | ICD-10-CM | POA: Diagnosis present

## 2012-12-24 DIAGNOSIS — H9190 Unspecified hearing loss, unspecified ear: Secondary | ICD-10-CM | POA: Diagnosis present

## 2012-12-24 HISTORY — DX: Primary lateral sclerosis: G12.23

## 2012-12-24 HISTORY — DX: Spondylolisthesis, lumbosacral region: M43.17

## 2012-12-24 LAB — PROTIME-INR: INR: 1.09 (ref 0.00–1.49)

## 2012-12-24 LAB — URINE MICROSCOPIC-ADD ON

## 2012-12-24 LAB — COMPREHENSIVE METABOLIC PANEL
ALT: 66 U/L — ABNORMAL HIGH (ref 0–53)
AST: 71 U/L — ABNORMAL HIGH (ref 0–37)
CO2: 26 mEq/L (ref 19–32)
Chloride: 94 mEq/L — ABNORMAL LOW (ref 96–112)
GFR calc non Af Amer: 62 mL/min — ABNORMAL LOW (ref >90)
Sodium: 132 mEq/L — ABNORMAL LOW (ref 135–145)
Total Bilirubin: 10.6 mg/dL — ABNORMAL HIGH (ref 0.3–1.2)

## 2012-12-24 LAB — CBC WITH DIFFERENTIAL/PLATELET
Basophils Absolute: 0 10*3/uL (ref 0.0–0.1)
Lymphocytes Relative: 3 % — ABNORMAL LOW (ref 12–46)
Neutro Abs: 16.8 10*3/uL — ABNORMAL HIGH (ref 1.7–7.7)
Neutrophils Relative %: 96 % — ABNORMAL HIGH (ref 43–77)
Platelets: 319 10*3/uL (ref 150–400)
RDW: 15.8 % — ABNORMAL HIGH (ref 11.5–15.5)
WBC: 17.6 10*3/uL — ABNORMAL HIGH (ref 4.0–10.5)

## 2012-12-24 LAB — LIPASE, BLOOD: Lipase: 2136 U/L — ABNORMAL HIGH (ref 11–59)

## 2012-12-24 LAB — URINALYSIS, ROUTINE W REFLEX MICROSCOPIC
Glucose, UA: 100 mg/dL — AB
Protein, ur: 30 mg/dL — AB
Specific Gravity, Urine: 1.025 (ref 1.005–1.030)
Urobilinogen, UA: 1 mg/dL (ref 0.0–1.0)

## 2012-12-24 LAB — BILIRUBIN, DIRECT: Bilirubin, Direct: 8.5 mg/dL — ABNORMAL HIGH (ref 0.0–0.3)

## 2012-12-24 MED ORDER — SODIUM CHLORIDE 0.9 % IV SOLN: INTRAVENOUS | Status: DC

## 2012-12-24 MED ORDER — ENOXAPARIN SODIUM 40 MG/0.4ML ~~LOC~~ SOLN
40.0000 mg | SUBCUTANEOUS | Status: DC
  Administered 2012-12-25: 40 mg via SUBCUTANEOUS
  Filled 2012-12-24 (×2): qty 0.4

## 2012-12-24 MED ORDER — ONDANSETRON HCL 4 MG PO TABS: 4.0000 mg | ORAL_TABLET | Freq: Four times a day (QID) | ORAL | Status: DC | PRN

## 2012-12-24 MED ORDER — PANTOPRAZOLE SODIUM 40 MG IV SOLR: 40.0000 mg | INTRAVENOUS | Status: DC

## 2012-12-24 MED ORDER — POTASSIUM CHLORIDE IN NACL 20-0.9 MEQ/L-% IV SOLN
INTRAVENOUS | Status: DC
  Administered 2012-12-24: 22:00:00 via INTRAVENOUS

## 2012-12-24 MED ORDER — DEXTROSE 5 % IV SOLN
INTRAVENOUS | Status: AC
  Filled 2012-12-24 (×3): qty 10

## 2012-12-24 MED ORDER — ACETAMINOPHEN 325 MG PO TABS: 650.0000 mg | ORAL_TABLET | Freq: Four times a day (QID) | ORAL | Status: DC | PRN

## 2012-12-24 MED ORDER — PIPERACILLIN-TAZOBACTAM 3.375 G IVPB
INTRAVENOUS | Status: AC
  Filled 2012-12-24: qty 50

## 2012-12-24 MED ORDER — SODIUM CHLORIDE 0.9 % IV BOLUS (SEPSIS)
1000.0000 mL | Freq: Once | INTRAVENOUS | Status: AC
  Administered 2012-12-24: 1000 mL via INTRAVENOUS

## 2012-12-24 MED ORDER — ACETAMINOPHEN 650 MG RE SUPP: 650.0000 mg | Freq: Four times a day (QID) | RECTAL | Status: DC | PRN

## 2012-12-24 MED ORDER — ONDANSETRON HCL 4 MG/2ML IJ SOLN
4.0000 mg | Freq: Four times a day (QID) | INTRAMUSCULAR | Status: DC | PRN
Start: 2012-12-24 — End: 2012-12-27

## 2012-12-24 MED ORDER — MORPHINE SULFATE 2 MG/ML IJ SOLN: 1.0000 mg | INTRAMUSCULAR | Status: DC | PRN

## 2012-12-24 MED ORDER — SODIUM CHLORIDE 0.9 % IJ SOLN
3.0000 mL | Freq: Two times a day (BID) | INTRAMUSCULAR | Status: DC
  Administered 2012-12-24 – 2012-12-26 (×3): 3 mL via INTRAVENOUS

## 2012-12-24 MED ORDER — IOHEXOL 300 MG/ML  SOLN
100.0000 mL | Freq: Once | INTRAMUSCULAR | Status: AC | PRN
  Administered 2012-12-24: 100 mL via INTRAVENOUS

## 2012-12-24 MED ORDER — PANTOPRAZOLE SODIUM 40 MG IV SOLR
40.0000 mg | INTRAVENOUS | Status: DC
  Administered 2012-12-25: 40 mg via INTRAVENOUS
  Filled 2012-12-24: qty 40

## 2012-12-24 MED ORDER — PIPERACILLIN-TAZOBACTAM 3.375 G IVPB
3.3750 g | Freq: Once | INTRAVENOUS | Status: AC
  Administered 2012-12-24: 3.375 g via INTRAVENOUS
  Filled 2012-12-24: qty 50

## 2012-12-24 MED ORDER — IOHEXOL 300 MG/ML  SOLN
50.0000 mL | Freq: Once | INTRAMUSCULAR | Status: AC | PRN
  Administered 2012-12-24: 50 mL via ORAL

## 2012-12-24 MED ORDER — PIPERACILLIN-TAZOBACTAM 3.375 G IVPB
3.3750 g | Freq: Three times a day (TID) | INTRAVENOUS | Status: DC
  Administered 2012-12-24 – 2012-12-27 (×8): 3.375 g via INTRAVENOUS
  Filled 2012-12-24 (×18): qty 50

## 2012-12-24 NOTE — H&P (Signed)
[facility] History and Physical  [name] ZOX:[id number] DOB: Feb 23, 1936 DOA: 12/24/2012  Referring physician:  PCP: Josue Hector, MD  Specialists:   Chief Complaint:   HPI: [name] is a 77 y.o. male with past medical history that includes hypertension, GERD, coronary artery disease, primary lateral sclerosis, presents to the emergency department with chief complaint of pain. He indicates that he has suffered from abdominal pain off and on for the last 6 weeks. He states that the pain is mostly in his lower quadrants and radiates to both hips and down both legs. He takes that he has had low back pain for many years and that this pain is different from that pain. He describes the pain as sharp constant rates it a 6/10 at worst. He denies nausea vomiting fever or chills. He denies any constipation diarrhea melena or hematochezia. He indicates that he has been eating and drinking his normal amount and has suffered from no unintentional weight loss. He also reports that he had a cholecystectomy in 2010 and he also currently has a baclofen pump located in the right lower quadrant. He denies any chest pain palpitation shortness of breath headache visual disturbances. Initial evaluation in the emergency department yields lab work significant for sodium of 132 chloride 94 GFR 62 glucose 117 alkaline phosphatase alignment phosphate 391, lipase 2136, AST 71, ALT 66, total bilirubin 10.6. In addition his white count is 17.6 hemoglobin 11.8. Analysis is significant for hyalin casts 30 protein large bilirubin and brown in color. CT of the abdomen yields: Abnormal degree of thickening and enhancement of the common bile duct and common hepatic duct walls, with surrounding stranding in the porta hepatis. Appearance raises suspicion for cholangitis. Distal esophageal wall thickening, suspicious for esophagitis. Mild omental stranding of uncertain significance. Small right inguinal hernia contains  a small amount of fluid density which is new. Noncalcified stable pleural plaques along the hemidiaphragms. Peripheral fibrosis in the lung bases. Sigmoid diverticulosis. The left L5 pedicle screw captures the superior endplate and has lucency along its threading, and accordingly may be loose. While in the emergency room his vital signs remained stable with a blood pressure 116/53 heart rate of 86 and respirations 25. He is afebrile and nontoxic appearing. His symptoms came on gradually, have persisted and worsened;  characterized as moderate to severe. [facility] are asked to admit    Review of Systems: The patient denies anorexia, fever,  vision loss, decreased hearing, hoarseness, chest pain, syncope, dyspnea on exertion, peripheral edema, balance deficits, hemoptysis, melena, hematochezia,  hematuria, incontinence, genital sores, muscle weakness, suspicious skin lesions, transient blindness,  depression, unusual weight change, abnormal bleeding, enlarged lymph nodes, angioedema, and breast masses.    Past Medical History  Diagnosis Date  . Syncope     in the past thought possibly vasovagal. no palpitations at this time  . SVT (supraventricular tachycardia)     documented with a rate of 170 BPM 11/09 adenosine treatment converted to regular rhythm patient placed on diltiazem  . Coronary artery disease     taxus stent to the right coronary artery 2005 low risk cardiolite 2008  . Hypertension   . GERD (gastroesophageal reflux disease)   . Primary lateral sclerosis     goes to [facility]  . Spondylolisthesis at L5-S1 level     fusion 2010   Past Surgical History  Procedure Laterality Date  . Baclofen pump refill  02/2010  . Angioplasty / stenting femoral  2005  .  Cholecystectomy    . Carotid endarterectomy    . Carotid endarterectomy     Social History:  reports that he has quit smoking. He does not have any smokeless tobacco history on file. He reports that he does  not drink alcohol or use illicit drugs. Patient is retired. He lives at home with his son. He is independent with ADLs. He ambulates with a walker No Known Allergies  FH: His mother died in her 12s. Her health history included hypertension and CAD. Father deceased patient is on sure of his age or his past medical history.  Prior to Admission medications   Medication Sig Start Date End Date Taking? Authorizing Provider  aspirin EC 81 MG tablet Take 81 mg by mouth daily.   Yes Historical Provider, MD  diltiazem (CARDIZEM CD) 240 MG 24 hr capsule Take 240 mg by mouth daily.   Yes Historical Provider, MD  esomeprazole (NEXIUM) 40 MG capsule Take 40 mg by mouth daily before breakfast.   Yes Historical Provider, MD  fish oil-omega-3 fatty acids 1000 MG capsule Take 2 g by mouth daily.   Yes Historical Provider, MD  HYDROcodone-acetaminophen (NORCO/VICODIN) 5-325 MG per tablet Take 0.5 tablets by mouth every 6 (six) hours as needed for pain.    Yes Historical Provider, MD  lisinopril (PRINIVIL,ZESTRIL) 5 MG tablet Take 5 mg by mouth daily.   Yes Historical Provider, MD  meloxicam (MOBIC) 7.5 MG tablet Take 7.5 mg by mouth daily.   Yes Historical Provider, MD  Multiple Vitamin (MULTIVITAMIN WITH MINERALS) TABS Take 0.5 tablets by mouth daily.   Yes Historical Provider, MD  rOPINIRole (REQUIP) 1 MG tablet Take 1 mg by mouth at bedtime.   Yes Historical Provider, MD  rosuvastatin (CRESTOR) 20 MG tablet Take 5 mg by mouth daily.   Yes Historical Provider, MD   Physical Exam: Filed Vitals:   12/24/12 1313  BP: 116/53  Pulse: 85  Temp:   Resp: 26  Temp 98. 02 sats 97%.   General:  Well-nourished somewhat chronically ill appearing jaundiced 76 year old Caucasian man, no acute distress  Eyes: PERRLA, EOMI, scleral icterus  ENT: Ears clear with hearing aid, nose without drainage oropharynx without erythema or exudate. Mucous membranes of his mouth are pink slightly dry  Neck: Supple no JVD full  range of motion no lymphadenopathy  Cardiovascular: Heart sounds with S1 and S2. No murmur no clicks no rubs appreciated. Trace lower extremity edema bilaterally. Pedal pulses present and palpable  Respiratory: Normal effort. Breath sounds somewhat distant but clear to auscultation bilaterally. I hear no wheezes no rhonchi. Mild use of abdominal muscles which appears to be chronic.  Abdomen: Soft , positive bowel sounds throughout but sluggish. Mild tenderness particularly in the upper quadrants bilaterally. Baclofen pump intact right midquadrant.   Skin: Skin is warm and dry without lesions or rashes. Mildly jaundiced  Musculoskeletal: Joints without erythema or edema. Mild resting tremor with upper extremities. Full range of motion throughout  Psychiatric: Labile affect with laughter and then he becomes tearful.  Neurologic: Alert and oriented x3. Speech is slow and deliberate but clear. Facial symmetry. Cranial nerves II through XII grossly intact  Labs on Admission:  Basic Metabolic Panel:  Recent Labs Lab 12/24/12 1053  NA 132*  K 3.7  CL 94*  CO2 26  GLUCOSE 117*  BUN 19  CREATININE 1.12  CALCIUM 9.7   Liver Function Tests:  Recent Labs Lab 12/24/12 1053  AST 71*  ALT 66*  ALKPHOS  391*  BILITOT 10.6*  PROT 6.9  ALBUMIN 2.7*    Recent Labs Lab 12/24/12 1105  LIPASE 2136*   No results found for this basename: AMMONIA,  in the last 168 hours CBC:  Recent Labs Lab 12/24/12 1053  WBC 17.6*  NEUTROABS 16.8*  HGB 11.8*  HCT 34.2*  MCV 87.2  PLT 319   Cardiac Enzymes: No results found for this basename: CKTOTAL, CKMB, CKMBINDEX, TROPONINI,  in the last 168 hours  BNP (last 3 results) No results found for this basename: PROBNP,  in the last 8760 hours CBG: No results found for this basename: GLUCAP,  in the last 168 hours  Radiological Exams on Admission: Ct Abdomen Pelvis W Contrast  12/24/2012   CLINICAL DATA:  Tremors. Jaundice. Body aches.  coronary artery disease. Right upper quadrant abdominal pain. Leukocytosis.  EXAM: CT ABDOMEN AND PELVIS WITH CONTRAST  TECHNIQUE: Multidetector CT imaging of the abdomen and pelvis was performed using the standard protocol following bolus administration of intravenous contrast.  CONTRAST:  OMNIPAQUE IOHEXOL 300 MG/ML  SOLN  COMPARISON:  12/06/2010  FINDINGS: Coronary and aortic atherosclerosis noted. Wall thickening in the distal esophagus noted with small distal paraesophageal lymph nodes. Most of these nodes are small, although 1 node with a fatty hilum measures up to 0.9 cm in short axis on image 8 of series 2. Peripheral fibrosis noted at the lung bases.  Noncalcified pleural plaques are present along both hemidiaphragms. These are similar to the prior exam.  Stable punctate hepatic calcifications are likely from remote granulomatous disease. The gallbladder surgically absent. There is greater than expected thickening and enhancement in the common hepatic duct and common bile duct wall. CBD luminal caliber proximally 7 mm. No well-defined filling defect.  Fatty pancreas noted without dilatation of the dorsal pancreatic duct. Spleen and adrenal glands normal. No significant renal or ureteral abnormality observed. Low-level stranding in the porta hepatis noted.  Appendix normal. Sigmoid diverticulosis without active diverticulitis. No overtly dilated small bowel noted. There is a diverticulum of the transverse duodenum just cephalad to the transverse duodenum.  Aortoiliac atherosclerotic calcification noted. No pathologic pelvic adenopathy is observed.  Urinary bladder unremarkable. Small density in the right stress that small density along the right inguinal canal, potentially a small amount of fluid along in inguinal hernia otherwise containing adipose tissue. The  A pump is noted with catheter extending up to the T9 level in the dorsal spinal canal. This posterolateral rod and pedicle screw fixation at  L4-5 bilaterally stress that there is posterolateral rod and pedicle screw formation at L5-S1 bilaterally, with the left L5 pedicle screw capturing the superior endplate and with potential lucency along the threading of this screw.  There is mild omental stranding of uncertain significance.  Stable appearance of chronic superior endplate compression fracture T12.  IMPRESSION: 1. Abnormal degree of thickening and enhancement of the common bile duct and common hepatic duct walls, with surrounding stranding in the porta hepatis. Appearance raises suspicion for cholangitis. 2. Distal esophageal wall thickening, suspicious for esophagitis. 3. Mild omental stranding of uncertain significance. 4. Small right inguinal hernia contains a small amount of fluid density which is new. 5. Noncalcified stable pleural plaques along the hemidiaphragms. 6. Peripheral fibrosis in the lung bases. 7. Sigmoid diverticulosis. 8. The left L5 pedicle screw captures the superior endplate and has lucency along its threading, and accordingly may be loose.   Electronically Signed   By: Herbie Baltimore   On: 12/24/2012 13:23  Dg Chest Portable 1 View  12/24/2012   CLINICAL DATA:  Tremors. Jaundice. Generalized body aches. History of hypertension, smoking.  EXAM: PORTABLE CHEST - 1 VIEW  COMPARISON:  12/06/2010  FINDINGS: The heart is enlarged. There is prominence of interstitial markings, increased since the prior study. Findings may be related to interstitial edema, infection or developing interstitial lung disease. No focal consolidations are identified.  IMPRESSION: 1. Cardiomegaly and prominent interstitial lung markings, favoring edema. 2. Infection or chronic lung disease are also considerations.   Electronically Signed   By: Rosalie Gums M.D.   On: 12/24/2012 11:16    EKG: Independently reviewed. Sinus tachycardia at 116.  Assessment/Plan Principal Problem:   Acute cholangitis: Etiology unclear. Patient had cholecystectomy in  2010. Will admit to telemetry. Will keep n.p.o. We'll provide analgesic and antiemetic as needed. Spoken with GI who will see him this afternoon. I recommend keeping him n.p.o. continue Zosyn per pharmacy. Will provide protonix IV daily as well Active Problems: Pancreatitis: Related to #1. Will keep n.p.o. We'll provide supportive therapy in the form of IV hydration, antiemetic, analgesia. Requested GI consult.    Leukocytosis: Patient is afebrile and nontoxic appearing at the time of my exam. Leukocytosis related to #1. Will provide Zosyn per pharmacy. Will monitor vital signs every 4x4.    Hyponatremia: Likely related to #1 and #2. Mild. Will provide IV fluids. Recheck in the a.m.    Jaundice: Secondary to #1. Total bilirubin is 10.7. Continue to monitor.    Anemia: Likely due to chronic disease. Chart review indicates 2 years ago hemoglobin was 14.6. Will check an anemia panel. Will check FOBT    Coronary artery disease: No chest pain. Patient is on aspirin and Crestor at home. Will hold these medications for now due to #1    Hypertension: Stable in the emergency department. Systolic pressure range 116 144. Patient is on Cardizem and lisinopril at home. Will hold these medications for now. Will monitor closely    GERD (gastroesophageal reflux disease): We'll continue PPI as patient takes Nexium at home.    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 He goes to the ALS clinic at [facility] in [place].  Esophagitis: Per CT. Likely related to ongoing GERD. Will continue PPI. GI consult pending  Diverticulosis: Per CT. Patient denies any melena or bright red blood per rectum.    Spondylolisthesis at L5-S1 level      Consultants: Gastroenterologist, Dr. Jena Gauss  Code Status: full Family Communication: son at bedside Disposition Plan: home when ready  Time spent: 69 minutes  Gwenyth Bender [facility] Pager [phone]  If 7PM-7AM, please  contact night-coverage [email or web address] Password [facility] 12/24/2012, 3:34 PM    Attending: The patient was seen and examined. He was discussed with nurse practitioner, Ms. Vedia Coffer. Agree with findings, assessment, and plan above. Gastroenterologist, Dr. Luvenia Starch assessment and plan noted and appreciated. Plan for ERCP tomorrow.     Elliot Cousin, M.D. [phone]

## 2012-12-24 NOTE — ED Provider Notes (Signed)
This chart was scribed for Layla Maw Ward, DO by Dorothey Baseman, ED Scribe. This patient was seen in room APA02/APA02 and the patient's care was started at 11:07 AM.   TIME SEEN: 11:07 AM  CHIEF COMPLAINT: tremors, generalized body aches  HPI:  HPI Comments: [name] is a 77 y.o. male with a history of PLS, CAD, HTN, and COPD who presents to the Emergency Department complaining of 2 days of diffuse, sharp abdominal pain without radiation. He reports he has had similar symptoms in the past but that his symptoms normally resolve within several hours. His family also reports that he has had yellowing of his skin and eyes. He has had similar symptoms in the past but again these improve within several hours. He denies fever but states he has had shaking chills. He has been nauseous. No vomiting, dysuria, hematuria, melena, or hematochezia. He reports diarrhea. He reports a remote history of cholecystectomy and currently has a baclofen pump in the RLQ for PLS. He reports that he occasionally takes hydrocodone at home with relief. He does not take Tylenol daily. Does not drink alcohol.   PCP- Dr. Lysbeth Galas   ROS: See HPI Constitutional: no fever  Eyes: no drainage  ENT: no runny nose   Cardiovascular:  no chest pain  Resp: no SOB  GI: no vomiting, yes diarrhea GU: no dysuria Integumentary: no rash  Allergy: no hives  Musculoskeletal: no leg swelling  Neurological: no slurred speech ROS otherwise negative  PAST MEDICAL HISTORY/PAST SURGICAL HISTORY:  Past Medical History  Diagnosis Date  . Syncope     in the past thought possibly vasovagal. no palpitations at this time  . SVT (supraventricular tachycardia)     documented with a rate of 170 BPM 11/09 adenosine treatment converted to regular rhythm patient placed on diltiazem  . Coronary artery disease     taxus stent to the right coronary artery 2005 low risk cardiolite 2008  . Hypertension   . GERD (gastroesophageal reflux disease)   .  Primary lateral sclerosis     MEDICATIONS:  Prior to Admission medications   Medication Sig Start Date End Date Taking? Authorizing Provider  aspirin EC 81 MG tablet Take 81 mg by mouth daily.    Historical Provider, MD  diltiazem (CARDIZEM CD) 240 MG 24 hr capsule Take 240 mg by mouth daily.    Historical Provider, MD  esomeprazole (NEXIUM) 40 MG capsule Take 40 mg by mouth daily before breakfast.    Historical Provider, MD  famotidine (PEPCID) 20 MG tablet Take 1 tablet (20 mg total) by mouth 2 (two) times daily. 10/26/12   Ward Givens, MD  fish oil-omega-3 fatty acids 1000 MG capsule Take 2 g by mouth daily.    Historical Provider, MD  HYDROcodone-acetaminophen (NORCO/VICODIN) 5-325 MG per tablet Take 1 tablet by mouth every 6 (six) hours as needed for pain.    Historical Provider, MD  hydrOXYzine (ATARAX/VISTARIL) 25 MG tablet Take 1 tablet (25 mg total) by mouth every 6 (six) hours as needed for itching. 10/26/12   Ward Givens, MD  lisinopril (PRINIVIL,ZESTRIL) 5 MG tablet Take 5 mg by mouth daily.    Historical Provider, MD  meloxicam (MOBIC) 7.5 MG tablet Take 7.5 mg by mouth daily.    Historical Provider, MD  Multiple Vitamin (MULTIVITAMIN WITH MINERALS) TABS Take 0.5 tablets by mouth daily.    Historical Provider, MD  predniSONE (DELTASONE) 20 MG tablet Take 3 po QD x 2d starting tomorrow,  then 2 po QD x 3d then 1 po QD x 3d 10/26/12   Ward Givens, MD  ROPINIROLE HCL PO Take 1 tablet by mouth at bedtime.    Historical Provider, MD  rosuvastatin (CRESTOR) 20 MG tablet Take 5 mg by mouth daily.    Historical Provider, MD    ALLERGIES:  No Known Allergies  SOCIAL HISTORY:  History  Substance Use Topics  . Smoking status: Former Games developer  . Smokeless tobacco: Not on file  . Alcohol Use: No    FAMILY HISTORY: No family history on file.  EXAM:  There were no vitals taken for this visit.  CONSTITUTIONAL: Alert and oriented and responds appropriately to questions. Well-appearing;  well-nourished HEAD: Normocephalic EYES: Conjunctivae clear, PERRL; scleral icterus ENT: normal nose; no rhinorrhea; moist mucous membranes; pharynx without lesions noted NECK: Supple, no meningismus, no LAD  CARD: RRR; S1 and S2 appreciated; no murmurs, no clicks, no rubs, no gallops RESP: Normal chest excursion without splinting or tachypnea; breath sounds clear and equal bilaterally; no wheezes, no rhonchi, no rales,  ABD/GI: Normal bowel sounds; non-distended; soft, tenderness to palpation to the RUQ, no rebound, no guarding; baclofen pump in the RLQ BACK:  The back appears normal and is non-tender to palpation, there is no CVA tenderness EXT: Normal ROM in all joints; non-tender to palpation; no edema; normal capillary refill; no cyanosis    SKIN: Mildly jaundiced, warm NEURO: Moves all extremities equally PSYCH: The patient's mood and manner are appropriate. Grooming and personal hygiene are appropriate.  MEDICAL DECISION MAKING: 11:12AM- Will order labs, urine and abdominal imaging. Offered the patient pain medication, but he refused. Discussed treatment plan with patient at bedside and patient verbalized agreement.    Date: 12/24/2012 10:36 AM  Rate: 116  Rhythm: Sinus tachycardia  QRS Axis: normal  Intervals: Incomplete right bundle branch block  ST/T Wave abnormalities: normal  Conduction Disutrbances: none  Narrative Interpretation: Sinus tachycardia, no ischemic changes, incomplete right bundle branch block, artifact present, unchanged compared to prior EKG   12:42PM- Discussed suspicion for pancreatitis. Discussed treatment plan with patient at bedside and patient verbalized agreement.   ED PROGRESS: Per CT scan, pt has cholangitis. Will treat with Zosyn. He is currently hemodynamically stable. Patient and family updated on plan.  Discussed with hospitalist for admission.   I personally performed the services described in this documentation, which was scribed in my  presence. The recorded information has been reviewed and is accurate.    Layla Maw Ward, DO 12/24/12 1654

## 2012-12-24 NOTE — ED Notes (Signed)
Pt reports has been jaundice for the past week or so and c/o tremors.  Also c/o generalized body aches.  Pt says lives with his son and has had these episodes of being jaundice before but did not see his doctor for it.  Son says usually he just gives him a lot of fluids to drink and the jaundice clears up.

## 2012-12-24 NOTE — Consult Note (Signed)
Referring Provider: Toya Smothers, NP Primary Care Physician:  Josue Hector, MD Primary Gastroenterologist:  Dr. Jena Gauss   Date of Admission: 12/24/12 Date of Consultation: 12/24/12  Reason for Consultation:  Cholangitis  HPI:  [name] is a pleasant 77 year old male presenting to the ED with complaints of worsening abdominal pain. He reports at least a 6 week history of episodic diffuse abdominal pain located in lower abdomen and radiating down his hips and legs, like "in a vice". Upper abdomen is sore intermittently. Feels "cold and shaky" with episodes, which have occurred at least 5 different times in the last 6-8 weeks per [name]. Denies any nausea or vomiting. No precipitating or alleviating factors of symptoms. No associating with eating. Denies lack of appetite or weight loss. States he presented to the ED because he "got tired" of the pain. Noted yellowing of his skin, tea-colored urine, itching. 1 loose stool this morning otherwise no changes in bowel habits. No melena, hematochezia. Chronic reflux, on Nexium as outpatient. Denies dysphagia.  No prior EGD or colonoscopy. Admitting labs show leukocytosis with white count 17.6, elevated bilirubin at 10.6, mild elevation of transaminases, elevated alk phos. Lipase in the 2000s. CT abd/pelvis as below with concern for cholangitis, esophagitis, further details below.    Past Medical History  Diagnosis Date  . Syncope     in the past thought possibly vasovagal. no palpitations at this time  . SVT (supraventricular tachycardia)     documented with a rate of 170 BPM 11/09 adenosine treatment converted to regular rhythm [name] placed on diltiazem  . Coronary artery disease     taxus stent to the right coronary artery 2005 low risk cardiolite 2008  . Hypertension   . GERD (gastroesophageal reflux disease)   . Primary lateral sclerosis     goes to [facility]  . Spondylolisthesis at L5-S1 level     fusion 2010    Past  Surgical History  Procedure Laterality Date  . Baclofen pump refill  02/2010  . Angioplasty / stenting femoral  2005  . Cholecystectomy    . Carotid endarterectomy    . Carotid endarterectomy      Prior to Admission medications   Medication Sig Start Date End Date Taking? Authorizing Provider  aspirin EC 81 MG tablet Take 81 mg by mouth daily.   Yes Historical Provider, MD  diltiazem (CARDIZEM CD) 240 MG 24 hr capsule Take 240 mg by mouth daily.   Yes Historical Provider, MD  esomeprazole (NEXIUM) 40 MG capsule Take 40 mg by mouth daily before breakfast.   Yes Historical Provider, MD  fish oil-omega-3 fatty acids 1000 MG capsule Take 2 g by mouth daily.   Yes Historical Provider, MD  HYDROcodone-acetaminophen (NORCO/VICODIN) 5-325 MG per tablet Take 0.5 tablets by mouth every 6 (six) hours as needed for pain.    Yes Historical Provider, MD  lisinopril (PRINIVIL,ZESTRIL) 5 MG tablet Take 5 mg by mouth daily.   Yes Historical Provider, MD  meloxicam (MOBIC) 7.5 MG tablet Take 7.5 mg by mouth daily.   Yes Historical Provider, MD  Multiple Vitamin (MULTIVITAMIN WITH MINERALS) TABS Take 0.5 tablets by mouth daily.   Yes Historical Provider, MD  rOPINIRole (REQUIP) 1 MG tablet Take 1 mg by mouth at bedtime.   Yes Historical Provider, MD  rosuvastatin (CRESTOR) 20 MG tablet Take 5 mg by mouth daily.   Yes Historical Provider, MD    No current facility-administered medications for this encounter.   Current Outpatient  Prescriptions  Medication Sig Dispense Refill  . aspirin EC 81 MG tablet Take 81 mg by mouth daily.      Marland Kitchen diltiazem (CARDIZEM CD) 240 MG 24 hr capsule Take 240 mg by mouth daily.      Marland Kitchen esomeprazole (NEXIUM) 40 MG capsule Take 40 mg by mouth daily before breakfast.      . fish oil-omega-3 fatty acids 1000 MG capsule Take 2 g by mouth daily.      Marland Kitchen HYDROcodone-acetaminophen (NORCO/VICODIN) 5-325 MG per tablet Take 0.5 tablets by mouth every 6 (six) hours as needed for pain.        Marland Kitchen lisinopril (PRINIVIL,ZESTRIL) 5 MG tablet Take 5 mg by mouth daily.      . meloxicam (MOBIC) 7.5 MG tablet Take 7.5 mg by mouth daily.      . Multiple Vitamin (MULTIVITAMIN WITH MINERALS) TABS Take 0.5 tablets by mouth daily.      Marland Kitchen rOPINIRole (REQUIP) 1 MG tablet Take 1 mg by mouth at bedtime.      . rosuvastatin (CRESTOR) 20 MG tablet Take 5 mg by mouth daily.        Allergies as of 12/24/2012  . (No Known Allergies)    Family History  Problem Relation Age of Onset  . Colon cancer Neg Hx     History   Social History  . Marital Status: Widowed    Spouse Name: N/A    Number of Children: N/A  . Years of Education: N/A   Occupational History  . Not on file.   Social History Main Topics  . Smoking status: Former Games developer  . Smokeless tobacco: Not on file  . Alcohol Use: No  . Drug Use: No  . Sexual Activity: Not on file   Other Topics Concern  . Not on file   Social History Narrative  . No narrative on file    Review of Systems: As mentioned in HPI.   Physical Exam: Vital signs in last 24 hours: Temp:  [98.6 F (37 C)] 98.6 F (37 C) (09/23 1312) Pulse Rate:  [80-118] 85 (09/23 1313) Resp:  [25-29] 26 (09/23 1313) BP: (116-169)/(53-78) 116/53 mmHg (09/23 1313) SpO2:  [91 %-99 %] 97 % (09/23 1313)   General:   Alert, jaundiced, pleasant, non-toxic appearing.  Head:  Normocephalic and atraumatic. Eyes:  Scleral icterus Ears:  Hearing aid in place Nose:  No deformity, discharge,  or lesions. Mouth:  No deformity or lesions, dentures Neck:  Supple; no masses or thyromegaly. Lungs:  Clear throughout to auscultation. No distress but notable use of accessory muscles, non-labored, mild tachypnea, which [name] states is his baseline. Heart:  S1 S2 present; no murmurs, clicks, rubs,  or gallops. Abdomen:  Soft, TTP upper abdomen and nondistended. Rounded area RUQ consistent with pump placement.  Rectal:  Deferred  Msk:  Symmetrical without gross deformities.  Normal posture. Extremities:  Without clubbing or edema. Neurologic:  Alert and  oriented x4;  grossly normal neurologically. Skin:  Intact without significant lesions or rashes. Cervical Nodes:  No significant cervical adenopathy. Psych:  Alert and cooperative. Normal mood and affect.  Intake/Output from previous day:   Intake/Output this shift: Total I/O In: -  Out: 1250 [Urine:1250]  Lab Results:  Recent Labs  12/24/12 1053  WBC 17.6*  HGB 11.8*  HCT 34.2*  PLT 319   BMET  Recent Labs  12/24/12 1053  NA 132*  K 3.7  CL 94*  CO2 26  GLUCOSE 117*  BUN 19  CREATININE 1.12  CALCIUM 9.7   LFT  Recent Labs  12/24/12 1053  PROT 6.9  ALBUMIN 2.7*  AST 71*  ALT 66*  ALKPHOS 391*  BILITOT 10.6*   PT/INR  Recent Labs  12/24/12 1105  LABPROT 13.9  INR 1.09    Studies/Results: Ct Abdomen Pelvis W Contrast  12/24/2012   CLINICAL DATA:  Tremors. Jaundice. Body aches. coronary artery disease. Right upper quadrant abdominal pain. Leukocytosis.  EXAM: CT ABDOMEN AND PELVIS WITH CONTRAST  TECHNIQUE: Multidetector CT imaging of the abdomen and pelvis was performed using the standard protocol following bolus administration of intravenous contrast.  CONTRAST:  OMNIPAQUE IOHEXOL 300 MG/ML  SOLN  COMPARISON:  12/06/2010  FINDINGS: Coronary and aortic atherosclerosis noted. Wall thickening in the distal esophagus noted with small distal paraesophageal lymph nodes. Most of these nodes are small, although 1 node with a fatty hilum measures up to 0.9 cm in short axis on image 8 of series 2. Peripheral fibrosis noted at the lung bases.  Noncalcified pleural plaques are present along both hemidiaphragms. These are similar to the prior exam.  Stable punctate hepatic calcifications are likely from remote granulomatous disease. The gallbladder surgically absent. There is greater than expected thickening and enhancement in the common hepatic duct and common bile duct wall. CBD  luminal caliber proximally 7 mm. No well-defined filling defect.  Fatty pancreas noted without dilatation of the dorsal pancreatic duct. Spleen and adrenal glands normal. No significant renal or ureteral abnormality observed. Low-level stranding in the porta hepatis noted.  Appendix normal. Sigmoid diverticulosis without active diverticulitis. No overtly dilated small bowel noted. There is a diverticulum of the transverse duodenum just cephalad to the transverse duodenum.  Aortoiliac atherosclerotic calcification noted. No pathologic pelvic adenopathy is observed.  Urinary bladder unremarkable. Small density in the right stress that small density along the right inguinal canal, potentially a small amount of fluid along in inguinal hernia otherwise containing adipose tissue. The  A pump is noted with catheter extending up to the T9 level in the dorsal spinal canal. This posterolateral rod and pedicle screw fixation at L4-5 bilaterally stress that there is posterolateral rod and pedicle screw formation at L5-S1 bilaterally, with the left L5 pedicle screw capturing the superior endplate and with potential lucency along the threading of this screw.  There is mild omental stranding of uncertain significance.  Stable appearance of chronic superior endplate compression fracture T12.  IMPRESSION: 1. Abnormal degree of thickening and enhancement of the common bile duct and common hepatic duct walls, with surrounding stranding in the porta hepatis. Appearance raises suspicion for cholangitis. 2. Distal esophageal wall thickening, suspicious for esophagitis. 3. Mild omental stranding of uncertain significance. 4. Small right inguinal hernia contains a small amount of fluid density which is new. 5. Noncalcified stable pleural plaques along the hemidiaphragms. 6. Peripheral fibrosis in the lung bases. 7. Sigmoid diverticulosis. 8. The left L5 pedicle screw captures the superior endplate and has lucency along its threading, and  accordingly may be loose.   Electronically Signed   By: Herbie Baltimore   On: 12/24/2012 13:23   Dg Chest Portable 1 View  12/24/2012   CLINICAL DATA:  Tremors. Jaundice. Generalized body aches. History of hypertension, smoking.  EXAM: PORTABLE CHEST - 1 VIEW  COMPARISON:  12/06/2010  FINDINGS: The heart is enlarged. There is prominence of interstitial markings, increased since the prior study. Findings may be related to interstitial edema, infection or developing interstitial lung disease. No  focal consolidations are identified.  IMPRESSION: 1. Cardiomegaly and prominent interstitial lung markings, favoring edema. 2. Infection or chronic lung disease are also considerations.   Electronically Signed   By: Rosalie Gums M.D.   On: 12/24/2012 11:16    Impression: 77 year old male admitted with several week history of vague reports of abdominal pain, noted to have elevated LFTs, lipase and concern for cholangitis due to findings on CT scan. Needs fractionated bilirubin and repeat LFTs tomorrow. May ultimately need ERCP, but we will attempt to obtain an MRCP first. Due to presence of baclofen pump, this may be a contraindication. Will need to discuss with MRI.   Plan: Conjugated bilirubin now Agree with Zosyn NPO for now PPI for GI prophylaxis Repeat HFP in am Possible MRCP: pending further evaluation of baclofen pump model.  Nira Retort, ANP-BC [facility]  4:31 PM     LOS: 0 days    12/24/2012,    Attending note:  [name] seen and examined in ED room #2;  Reviewed CT in detail with Dr. Tyron Russell.  [name] presents, clinically, with cholangitis. He's likely had recurrent bouts of bacteremia with Rigors and abdominal pain over the past 6 weeks. His bile duct is not dilated but is markedly enhanced on cross sectional imaging. His pancreas actually appears normal. I suspect his abdominal pain (and physical exam) may be somewhat unreliable given his coexisting neurological  condition. Would like to get some additional information about his bile duct, noninvasively, via an MRCP. However, he is not a candidate for MRI because of his baclofen pump. Ultimately, he needs an ERCP to deal with cholangitis. It may well be that the presence (and location) of the baclofen pump will also present  unique challenges for getting good cholangiogram films at time of the ERCP.  I have offered this nice gentleman an ERCP with therapeutic intent tomorrow.  The risks, benefits, limitations, alternatives, imnponderables have been reviewed with the [name] and his stepson. I specifically discussed a 1 in 10 chance of pancreatitis, reaction to medications, bleeding, perforation and the possibility of a failed ERCP. Potential for sphincterotomy and stent placement also reviewed. Questions have been answered. All parties agreeable. Agree with continuing antibiotics. Further recommendations to follow.

## 2012-12-24 NOTE — Progress Notes (Signed)
ANTIBIOTIC CONSULT NOTE - INITIAL  Pharmacy Consult for Zosyn Indication: Cholangitis  No Known Allergies  Patient Measurements: Height: 5\' 7"  (170.2 cm) Weight: 154 lb 15.7 oz (70.3 kg) IBW/kg (Calculated) : 66.1  Vital Signs: Temp: 98 F (36.7 C) (09/23 1756) Temp src: Oral (09/23 1756) BP: 123/73 mmHg (09/23 1756) Pulse Rate: 79 (09/23 1756) Intake/Output from previous day:   Intake/Output from this shift: Total I/O In: -  Out: 1250 [Urine:1250]  Labs:  Recent Labs  12/24/12 1053  WBC 17.6*  HGB 11.8*  PLT 319  CREATININE 1.12   Estimated Creatinine Clearance: 53.3 ml/min (by C-G formula based on Cr of 1.12). No results found for this basename: VANCOTROUGH, VANCOPEAK, VANCORANDOM, GENTTROUGH, GENTPEAK, GENTRANDOM, TOBRATROUGH, TOBRAPEAK, TOBRARND, AMIKACINPEAK, AMIKACINTROU, AMIKACIN,  in the last 72 hours   Microbiology: No results found for this or any previous visit (from the past 720 hour(s)).  Medical History: Past Medical History  Diagnosis Date  . Syncope     in the past thought possibly vasovagal. no palpitations at this time  . SVT (supraventricular tachycardia)     documented with a rate of 170 BPM 11/09 adenosine treatment converted to regular rhythm patient placed on diltiazem  . Coronary artery disease     taxus stent to the right coronary artery 2005 low risk cardiolite 2008  . Hypertension   . GERD (gastroesophageal reflux disease)   . Primary lateral sclerosis     goes to [facility]  . Spondylolisthesis at L5-S1 level     fusion 2010    Medications:  Scheduled:  . enoxaparin (LOVENOX) injection  40 mg Subcutaneous Q24H  . pantoprazole (PROTONIX) IV  40 mg Intravenous Q24H  . sodium chloride  3 mL Intravenous Q12H   Assessment: Okay for Protocol Received initial dose of Zosyn in ED.  Zosyn 9/23 >>  Goal of Therapy:  Eradicate infection.  Plan:  Zosyn 3.375gm IV every 8 hours. Follow-up micro data, labs,  vitals.  Mady Gemma 12/24/2012,6:45 PM

## 2012-12-25 ENCOUNTER — Inpatient Hospital Stay (HOSPITAL_COMMUNITY): Payer: Medicare Other

## 2012-12-25 ENCOUNTER — Encounter (HOSPITAL_COMMUNITY)
Admission: EM | Disposition: A | Discharge status: Home / Self Care | Payer: Self-pay | Source: Home / Self Care | Attending: Internal Medicine

## 2012-12-25 ENCOUNTER — Encounter (HOSPITAL_COMMUNITY): Payer: Self-pay | Admitting: Anesthesiology

## 2012-12-25 ENCOUNTER — Inpatient Hospital Stay (HOSPITAL_COMMUNITY): Payer: Medicare Other | Admitting: Anesthesiology

## 2012-12-25 ENCOUNTER — Encounter (HOSPITAL_COMMUNITY): Payer: Self-pay | Admitting: *Deleted

## 2012-12-25 DIAGNOSIS — I2510 Atherosclerotic heart disease of native coronary artery without angina pectoris: Secondary | ICD-10-CM

## 2012-12-25 HISTORY — PX: ERCP: SHX5425

## 2012-12-25 HISTORY — PX: REMOVAL OF STONES: SHX5545

## 2012-12-25 HISTORY — PX: SPHINCTEROTOMY: SHX5544

## 2012-12-25 HISTORY — PX: BILIARY STENT PLACEMENT: SHX5538

## 2012-12-25 LAB — COMPREHENSIVE METABOLIC PANEL
ALT: 45 U/L (ref 0–53)
Albumin: 2.1 g/dL — ABNORMAL LOW (ref 3.5–5.2)
Alkaline Phosphatase: 308 U/L — ABNORMAL HIGH (ref 39–117)
BUN: 17 mg/dL (ref 6–23)
Calcium: 9.3 mg/dL (ref 8.4–10.5)
Creatinine, Ser: 0.97 mg/dL (ref 0.50–1.35)
GFR calc Af Amer: >90 mL/min (ref >90)
GFR calc non Af Amer: 79 mL/min — ABNORMAL LOW (ref >90)
Glucose, Bld: 67 mg/dL — ABNORMAL LOW (ref 70–99)
Potassium: 3.4 mEq/L — ABNORMAL LOW (ref 3.5–5.1)
Total Protein: 5.8 g/dL — ABNORMAL LOW (ref 6.0–8.3)

## 2012-12-25 LAB — CBC
HCT: 31.9 % — ABNORMAL LOW (ref 39.0–52.0)
MCV: 87.6 fL (ref 78.0–100.0)
RBC: 3.64 MIL/uL — ABNORMAL LOW (ref 4.22–5.81)
RDW: 15.7 % — ABNORMAL HIGH (ref 11.5–15.5)
WBC: 9.4 10*3/uL (ref 4.0–10.5)

## 2012-12-25 SURGERY — ERCP, WITH INTERVENTION IF INDICATED
Anesthesia: General

## 2012-12-25 MED ORDER — MIDAZOLAM HCL 2 MG/2ML IJ SOLN
1.0000 mg | INTRAMUSCULAR | Status: DC | PRN
  Administered 2012-12-25: 2 mg via INTRAVENOUS

## 2012-12-25 MED ORDER — LACTATED RINGERS IV SOLN
INTRAVENOUS | Status: DC
  Administered 2012-12-25: 12:00:00 via INTRAVENOUS

## 2012-12-25 MED ORDER — KCL IN DEXTROSE-NACL 20-5-0.45 MEQ/L-%-% IV SOLN
INTRAVENOUS | Status: DC
  Administered 2012-12-25: 09:00:00 via INTRAVENOUS

## 2012-12-25 MED ORDER — SODIUM CHLORIDE 0.9 % IV SOLN
INTRAVENOUS | Status: DC | PRN
  Administered 2012-12-25: 14:00:00

## 2012-12-25 MED ORDER — GLYCOPYRROLATE 0.2 MG/ML IJ SOLN
INTRAMUSCULAR | Status: AC
  Filled 2012-12-25: qty 1

## 2012-12-25 MED ORDER — ONDANSETRON HCL 4 MG/2ML IJ SOLN: 4.0000 mg | Freq: Once | INTRAMUSCULAR | Status: DC | PRN

## 2012-12-25 MED ORDER — ROPINIROLE HCL 1 MG PO TABS
1.0000 mg | ORAL_TABLET | Freq: Every day | ORAL | Status: DC
  Administered 2012-12-25 – 2012-12-26 (×2): 1 mg via ORAL
  Filled 2012-12-25 (×5): qty 1

## 2012-12-25 MED ORDER — LACTATED RINGERS IV SOLN
INTRAVENOUS | Status: DC | PRN
  Administered 2012-12-25 (×2): via INTRAVENOUS

## 2012-12-25 MED ORDER — PHENYLEPHRINE HCL 10 MG/ML IJ SOLN
INTRAMUSCULAR | Status: AC
  Filled 2012-12-25: qty 1

## 2012-12-25 MED ORDER — ONDANSETRON HCL 4 MG/2ML IJ SOLN
INTRAMUSCULAR | Status: AC
  Filled 2012-12-25: qty 2

## 2012-12-25 MED ORDER — ROCURONIUM BROMIDE 50 MG/5ML IV SOLN
INTRAVENOUS | Status: AC
  Filled 2012-12-25: qty 1

## 2012-12-25 MED ORDER — ONDANSETRON HCL 4 MG/2ML IJ SOLN
4.0000 mg | Freq: Once | INTRAMUSCULAR | Status: AC
  Administered 2012-12-25: 4 mg via INTRAVENOUS

## 2012-12-25 MED ORDER — MIDAZOLAM HCL 2 MG/2ML IJ SOLN
INTRAMUSCULAR | Status: AC
  Filled 2012-12-25: qty 2

## 2012-12-25 MED ORDER — FENTANYL CITRATE 0.05 MG/ML IJ SOLN
INTRAMUSCULAR | Status: AC
  Filled 2012-12-25: qty 5

## 2012-12-25 MED ORDER — PROPOFOL 10 MG/ML IV EMUL
INTRAVENOUS | Status: AC
  Filled 2012-12-25: qty 20

## 2012-12-25 MED ORDER — FENTANYL CITRATE 0.05 MG/ML IJ SOLN: 25.0000 ug | INTRAMUSCULAR | Status: DC | PRN

## 2012-12-25 MED ORDER — LIDOCAINE HCL (PF) 1 % IJ SOLN
INTRAMUSCULAR | Status: AC
  Filled 2012-12-25: qty 5

## 2012-12-25 MED ORDER — PROPOFOL 10 MG/ML IV BOLUS
INTRAVENOUS | Status: DC | PRN
  Administered 2012-12-25: 150 mg via INTRAVENOUS

## 2012-12-25 MED ORDER — ROPINIROLE HCL 1 MG PO TABS
ORAL_TABLET | ORAL | Status: AC
  Filled 2012-12-25: qty 1

## 2012-12-25 MED ORDER — FENTANYL CITRATE 0.05 MG/ML IJ SOLN
INTRAMUSCULAR | Status: DC | PRN
  Administered 2012-12-25 (×2): 50 ug via INTRAVENOUS
  Administered 2012-12-25: 100 ug via INTRAVENOUS

## 2012-12-25 MED ORDER — GLYCOPYRROLATE 0.2 MG/ML IJ SOLN
INTRAMUSCULAR | Status: AC
  Filled 2012-12-25: qty 2

## 2012-12-25 MED ORDER — GLUCAGON HCL (RDNA) 1 MG IJ SOLR
INTRAMUSCULAR | Status: AC
  Filled 2012-12-25: qty 1

## 2012-12-25 MED ORDER — SUCCINYLCHOLINE CHLORIDE 20 MG/ML IJ SOLN
INTRAMUSCULAR | Status: DC | PRN
  Administered 2012-12-25: 120 mg via INTRAVENOUS

## 2012-12-25 MED ORDER — DEXTROSE 50 % IV SOLN: 25.0000 mL | Freq: Once | INTRAVENOUS | Status: AC | PRN

## 2012-12-25 MED ORDER — ROCURONIUM BROMIDE 100 MG/10ML IV SOLN
INTRAVENOUS | Status: DC | PRN
  Administered 2012-12-25: 20 mg via INTRAVENOUS

## 2012-12-25 MED ORDER — GLYCOPYRROLATE 0.2 MG/ML IJ SOLN
INTRAMUSCULAR | Status: DC | PRN
  Administered 2012-12-25: 0.4 mg via INTRAVENOUS

## 2012-12-25 MED ORDER — GLYCOPYRROLATE 0.2 MG/ML IJ SOLN
0.2000 mg | Freq: Once | INTRAMUSCULAR | Status: AC
  Administered 2012-12-25: 0.2 mg via INTRAVENOUS

## 2012-12-25 MED ORDER — NEOSTIGMINE METHYLSULFATE 1 MG/ML IJ SOLN
INTRAMUSCULAR | Status: DC | PRN
  Administered 2012-12-25: 2 mg via INTRAVENOUS

## 2012-12-25 MED ORDER — SODIUM CHLORIDE 0.9 % IV SOLN
INTRAVENOUS | Status: AC
  Filled 2012-12-25: qty 50

## 2012-12-25 MED ORDER — STERILE WATER FOR IRRIGATION IR SOLN
Status: DC | PRN
  Administered 2012-12-25: 14:00:00

## 2012-12-25 MED ORDER — LIDOCAINE HCL (CARDIAC) 20 MG/ML IV SOLN
INTRAVENOUS | Status: DC | PRN
  Administered 2012-12-25: 50 mg via INTRAVENOUS

## 2012-12-25 SURGICAL SUPPLY — 28 items
BALLN RETRIEVAL 12X15 (BALLOONS) ×1 IMPLANT
BALN RTRVL 200 6-7FR 12-15 (BALLOONS) ×1
BASKET TRAPEZOID 3X6 (MISCELLANEOUS) IMPLANT
BASKET TRAPEZOID LITHO 2.5X5 (MISCELLANEOUS) IMPLANT
BLOCK BITE 60FR ADLT L/F BLUE (MISCELLANEOUS) ×1 IMPLANT
BSKT STON RTRVL TRAPEZOID 3X6 (MISCELLANEOUS)
DEVICE INFLATION ENCORE 26 (MISCELLANEOUS) IMPLANT
DEVICE LOCKING W-BIOPSY CAP (MISCELLANEOUS) ×2 IMPLANT
FLOOR PAD 36X40 (MISCELLANEOUS) ×2
GUIDEWIRE HYDRA JAGWIRE .35 (WIRE) IMPLANT
GUIDEWIRE JAG HINI 025X260CM (WIRE) IMPLANT
NDL HYPO 18GX1.5 BLUNT FILL (NEEDLE) IMPLANT
NEEDLE HYPO 18GX1.5 BLUNT FILL (NEEDLE) ×2 IMPLANT
PAD FLOOR 36X40 (MISCELLANEOUS) IMPLANT
SNARE ROTATE MED OVAL 20MM (MISCELLANEOUS) IMPLANT
SNARE SHORT THROW 27M MED OVAL (MISCELLANEOUS) IMPLANT
SPHINCTEROTOME AUTOTOME .25 (MISCELLANEOUS) IMPLANT
SPHINCTEROTOME HYDRATOME 44 (MISCELLANEOUS) ×4 IMPLANT
SPONGE GAUZE 4X4 12PLY (GAUZE/BANDAGES/DRESSINGS) ×1 IMPLANT
STENT RX PRELOADED 10FRX5CM (Stent) ×1 IMPLANT
SYR 20CC LL (SYRINGE) ×1 IMPLANT
SYR 3ML LL SCALE MARK (SYRINGE) IMPLANT
SYR 50ML LL SCALE MARK (SYRINGE) ×2 IMPLANT
SYSTEM CONTINUOUS INJECTION (MISCELLANEOUS) ×2 IMPLANT
TUBING ENDO SMARTCAP PENTAX (MISCELLANEOUS) IMPLANT
WALLSTENT METAL COVERED 10X60 (STENTS) IMPLANT
WALLSTENT METAL COVERED 10X80 (STENTS) IMPLANT
WATER STERILE IRR 1000ML POUR (IV SOLUTION) ×2 IMPLANT

## 2012-12-25 NOTE — Progress Notes (Signed)
Sprite given to drink. Tolerated well. 

## 2012-12-25 NOTE — Progress Notes (Signed)
[facility] PROGRESS NOTE  [name] ZOX:[id number] DOB: 07-15-1935 DOA: 12/24/2012 PCP: Josue Hector, MD  Assessment/Plan: Acute cholangitis: Etiology unclear. Patient had cholecystectomy in 2010. Remains n.p.o. In anticipation of ERCP today. Looks well this am. LFT's trending down. Total bili trending down. Continue analgesic and antiemetic as needed. Appreciate GI assistance. Continue protonix IV daily. Zosyn day #2. Active Problems:  Pancreatitis: Related to #1. See #1. Continue supportive therapy in the form of IV hydration, antiemetic, analgesia. Check lipase in am.  Leukocytosis: Resolved this am. Patient is afebrile and nontoxic appearing at the time of my exam. Leukocytosis related to #1. Zosyn day #2.   Hyponatremia: Likely related to #1 and #2.. Mild. IV fluids changed to D5 1/2 NS +20KCL.  Monitor.  Jaundice: Secondary to #1. Total bilirubin trending downward.  Continue to monitor.   Anemia: Likely due to chronic disease. Chart review indicates 2 years ago hemoglobin was 14.6. Will check an anemia panel. Will check FOBT   Coronary artery disease: No chest pain. Patient is on aspirin and Crestor at home. Will hold these medications for now due to #1   Hypertension: Slightly labile. SBP range 93-137. Patient is on Cardizem and lisinopril at home. Will hold these medications for now. Will monitor closely   GERD (gastroesophageal reflux disease): We'll continue PPI as patient takes Nexium at home.   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 He goes to the ALS clinic at [facility] in [place].   Esophagitis: Per CT. Likely related to ongoing GERD. Will continue PPI.   Diverticulosis: Per CT. Patient denies any melena or bright red blood per rectum.   Spondylolisthesis at L5-S1 level s/p fusion 2010. Possible loose pedicle screw per CT. Will need OP follow up with neurosurgery  Code Status: full Family Communication: son at  bedside Disposition Plan: home when ready hopefully 24-48 hours.   Consultants:  GI  Procedures:  none  Antibiotics:  Zosyn 12/25/12>>>  HPI/Subjective: Sitting up in chair. Denies pain/discomfort.  Objective: Filed Vitals:   12/25/12 0515  BP: 93/54  Pulse: 63  Temp: 97.9 F (36.6 C)  Resp: 20    Intake/Output Summary (Last 24 hours) at 12/25/12 0915 Last data filed at 12/25/12 0612  Gross per 24 hour  Intake      0 ml  Output   2050 ml  Net  -2050 ml   Filed Weights   12/24/12 1756  Weight: 70.3 kg (154 lb 15.7 oz)    Exam:   General:  Well nourished, slightly jaundiced, smiling  Cardiovascular: RRR No MGR Trace LE edema  Respiratory: normal effort. BS distant but clear bilaterally no wheeze  Abdomen: soft positive BS x4 but sluggish. Mild tenderness particularly in upper quadrants. Baclofen pump intact  Musculoskeletal: no clubbing no cyanosis  Data Reviewed: Basic Metabolic Panel:  Recent Labs Lab 12/24/12 1053 12/25/12 0557  NA 132* 136  K 3.7 3.4*  CL 94* 102  CO2 26 25  GLUCOSE 117* 67*  BUN 19 17  CREATININE 1.12 0.97  CALCIUM 9.7 9.3   Liver Function Tests:  Recent Labs Lab 12/24/12 1053 12/25/12 0557  AST 71* 46*  ALT 66* 45  ALKPHOS 391* 308*  BILITOT 10.6* 9.2*  PROT 6.9 5.8*  ALBUMIN 2.7* 2.1*    Recent Labs Lab 12/24/12 1105  LIPASE 2136*   No results found for this basename: AMMONIA,  in the last 168 hours CBC:  Recent Labs Lab 12/24/12 1053 12/25/12 0557  WBC 17.6* 9.4  NEUTROABS 16.8*  --   HGB 11.8* 10.9*  HCT 34.2* 31.9*  MCV 87.2 87.6  PLT 319 286   Cardiac Enzymes: No results found for this basename: CKTOTAL, CKMB, CKMBINDEX, TROPONINI,  in the last 168 hours BNP (last 3 results) No results found for this basename: PROBNP,  in the last 8760 hours CBG: No results found for this basename: GLUCAP,  in the last 168 hours  No results found for this or any previous visit (from the past 240  hour(s)).   Studies: Ct Abdomen Pelvis W Contrast  12/24/2012   CLINICAL DATA:  Tremors. Jaundice. Body aches. coronary artery disease. Right upper quadrant abdominal pain. Leukocytosis.  EXAM: CT ABDOMEN AND PELVIS WITH CONTRAST  TECHNIQUE: Multidetector CT imaging of the abdomen and pelvis was performed using the standard protocol following bolus administration of intravenous contrast.  CONTRAST:  OMNIPAQUE IOHEXOL 300 MG/ML  SOLN  COMPARISON:  12/06/2010  FINDINGS: Coronary and aortic atherosclerosis noted. Wall thickening in the distal esophagus noted with small distal paraesophageal lymph nodes. Most of these nodes are small, although 1 node with a fatty hilum measures up to 0.9 cm in short axis on image 8 of series 2. Peripheral fibrosis noted at the lung bases.  Noncalcified pleural plaques are present along both hemidiaphragms. These are similar to the prior exam.  Stable punctate hepatic calcifications are likely from remote granulomatous disease. The gallbladder surgically absent. There is greater than expected thickening and enhancement in the common hepatic duct and common bile duct wall. CBD luminal caliber proximally 7 mm. No well-defined filling defect.  Fatty pancreas noted without dilatation of the dorsal pancreatic duct. Spleen and adrenal glands normal. No significant renal or ureteral abnormality observed. Low-level stranding in the porta hepatis noted.  Appendix normal. Sigmoid diverticulosis without active diverticulitis. No overtly dilated small bowel noted. There is a diverticulum of the transverse duodenum just cephalad to the transverse duodenum.  Aortoiliac atherosclerotic calcification noted. No pathologic pelvic adenopathy is observed.  Urinary bladder unremarkable. Small density in the right stress that small density along the right inguinal canal, potentially a small amount of fluid along in inguinal hernia otherwise containing adipose tissue. The  A pump is noted with  catheter extending up to the T9 level in the dorsal spinal canal. This posterolateral rod and pedicle screw fixation at L4-5 bilaterally stress that there is posterolateral rod and pedicle screw formation at L5-S1 bilaterally, with the left L5 pedicle screw capturing the superior endplate and with potential lucency along the threading of this screw.  There is mild omental stranding of uncertain significance.  Stable appearance of chronic superior endplate compression fracture T12.  IMPRESSION: 1. Abnormal degree of thickening and enhancement of the common bile duct and common hepatic duct walls, with surrounding stranding in the porta hepatis. Appearance raises suspicion for cholangitis. 2. Distal esophageal wall thickening, suspicious for esophagitis. 3. Mild omental stranding of uncertain significance. 4. Small right inguinal hernia contains a small amount of fluid density which is new. 5. Noncalcified stable pleural plaques along the hemidiaphragms. 6. Peripheral fibrosis in the lung bases. 7. Sigmoid diverticulosis. 8. The left L5 pedicle screw captures the superior endplate and has lucency along its threading, and accordingly may be loose.   Electronically Signed   By: Herbie Baltimore   On: 12/24/2012 13:23   Dg Chest Portable 1 View  12/24/2012   CLINICAL DATA:  Tremors. Jaundice. Generalized body aches. History of hypertension, smoking.  EXAM:  PORTABLE CHEST - 1 VIEW  COMPARISON:  12/06/2010  FINDINGS: The heart is enlarged. There is prominence of interstitial markings, increased since the prior study. Findings may be related to interstitial edema, infection or developing interstitial lung disease. No focal consolidations are identified.  IMPRESSION: 1. Cardiomegaly and prominent interstitial lung markings, favoring edema. 2. Infection or chronic lung disease are also considerations.   Electronically Signed   By: Rosalie Gums M.D.   On: 12/24/2012 11:16    Scheduled Meds: . enoxaparin (LOVENOX)  injection  40 mg Subcutaneous Q24H  . pantoprazole (PROTONIX) IV  40 mg Intravenous Q24H  . piperacillin-tazobactam (ZOSYN)  IV  3.375 g Intravenous Q8H  . sodium chloride  3 mL Intravenous Q12H   Continuous Infusions: . dextrose 5 % and 0.45 % NaCl with KCl 20 mEq/L 75 mL/hr at 12/25/12 8469    Principal Problem:   Acute cholangitis Active Problems:   Coronary artery disease   Hypertension   GERD (gastroesophageal reflux disease)   Primary lateral sclerosis   Spondylolisthesis at L5-S1 level   Pancreatitis   Leukocytosis   Hyponatremia   Jaundice   Anemia   Diverticulosis   Right inguinal hernia   Esophagitis   HOH (hard of hearing)    Time spent: 30 minutes    [facility] M  [facility] Pager [phone]. If 7PM-7AM, please contact night-coverage at [email or web address], password [facility] 12/25/2012, 9:15 AM  LOS: 1 day

## 2012-12-25 NOTE — Transfer of Care (Signed)
Immediate Anesthesia Transfer of Care Note  Patient: [name]  Procedure(s) Performed: Procedure(s) (LRB): ENDOSCOPIC RETROGRADE CHOLANGIOPANCREATOGRAPHY (ERCP) (N/A) SPHINCTEROTOMY (N/A) BILIARY STENT PLACEMENT (N/A)  Patient Location: PACU  Anesthesia Type: General  Level of Consciousness: awake  Airway & Oxygen Therapy: Patient Spontanous Breathing and non-rebreather face mask  Post-op Assessment: Report given to PACU RN, Post -op Vital signs reviewed and stable and Patient moving all extremities  Post vital signs: Reviewed and stable  Complications: No apparent anesthesia complications

## 2012-12-25 NOTE — Anesthesia Postprocedure Evaluation (Signed)
  Anesthesia Post-op Note  Patient: [name]  Procedure(s) Performed: Procedure(s): ENDOSCOPIC RETROGRADE CHOLANGIOPANCREATOGRAPHY (ERCP) (N/A) SPHINCTEROTOMY (N/A) BILIARY STENT PLACEMENT (N/A)  Patient Location: PACU  Anesthesia Type:General  Level of Consciousness: awake and patient cooperative  Airway and Oxygen Therapy: Patient Spontanous Breathing and non-rebreather face mask  Post-op Pain: none  Post-op Assessment: Post-op Vital signs reviewed, Patient's Cardiovascular Status Stable, Respiratory Function Stable, Patent Airway and No signs of Nausea or vomiting  Post-op Vital Signs: Reviewed and stable  Complications: No apparent anesthesia complications

## 2012-12-25 NOTE — Progress Notes (Signed)
UR Chart Review Completed  

## 2012-12-25 NOTE — Preoperative (Signed)
Beta Blockers   Reason not to administer Beta Blockers:Not Applicable 

## 2012-12-25 NOTE — Progress Notes (Signed)
77 yo M who presented with abdominal pain.  CT concerning for colangitis.  States he has some epigastric soreness, but otherwise feels well.  Denies fevers, chills, nausea, vomiting.  Passing stools.  VSS except for brief hypotension to SBP in the 90s this AM.    GEN:  Thin CM, no acute distress, obvious mild jaundice HEENT:  Scleral icterus, NCAT, MMM CV:  RRR, no mrg, 2+ pulses PULM:  CTAB ABD:  Hypoactive BS, soft, ND, mild TTP in the epigastrium, medial RUQ and periumbilical areas without rebound or guarding.   MSK:  No LEE Neuro:  Contractures forming and decreased muscle bulk RUE, particularly in hand and forearm  A/P:  Acute cholangitis in patient with hx of cholecystectomy.  Continue antibiotics and plan for ERCP today.  Continue NPO with IVF.  Leukocytosis resolved.  F/u anemia panel.    Esophagitis noted on CT.  Continue PPI  Possible loose pedicle screw at L5 on CT.  F/u with neurosurgery as outpatient.

## 2012-12-25 NOTE — Op Note (Signed)
[name], [name]                 ACCOUNT NO.:  [id number]  MEDICAL RECORD NO.:  [id number]  LOCATION:  A324                          FACILITY:  [facility]  PHYSICIAN:  R. Roetta Sessions, MD FACP FACGDATE OF BIRTH:  Jan 04, 1936  DATE OF PROCEDURE:  12/25/2012 DATE OF DISCHARGE:                              OPERATIVE REPORT   PROCEDURE:  ERCP with biliary sphincterotomy, balloon dredging of the bile duct sludge extraction/plastic stent placement.  INDICATIONS FOR PROCEDURE:  The patient is a pleasant 77 year old gentleman admitted to the hospital yesterday with cholangitis, bilirubin in excess of 10, elevated white count with a left shift.  He has had rigors and intermittent abdominal pain for 6 weeks.  CT demonstrates a markedly abnormal bile duct with impressive enhancement of the wall.  No obvious filling defect.  He is status post cholecystectomy for cholecystitis/cholelithiasis 4 years ago.  He is not a candidate for MRCP because of implantable device.  ERCP is now being done.  Potential risks, benefits, limitations, alternatives, imponderables have been discussed with the patient and the patient's son's.  Specifically talked about a 1 in 10 chance of pancreatitis, reaction to medications, bleeding and perforation.  If the stent is placed, subsequent procedure would be needed.  All questions answered and all parties agreeable.  ANESTHESIA:  General endotracheal anesthesia induced by Dr. Marcos Eke and associates.  The patient was placed in semiprone position.  The patient IV Zosyn.  INSTRUMENT:  Pentax video chip system.  FINDINGS:  Cursory examination of the distal esophagus, stomach revealed no abnormalities.  The scope was easily advanced across the pyloric channel into the 2nd portion of the duodenum.  The ampulla of Vater was somewhat bulging and readily identifiable on the medial wall of the 2nd portion of the duodenum.  Scope was pulled back to the short position about 55  cm with incisors.  Scout film was taken.  Utilizing the Microvasive sphincterotome, the ampulla was approached using guidewire palpation.  I almost immediately achieved deep biliary cannulation.  The sphincterotoe followed up.  Cholangiogram was performed.  There was dilation of the entire biliary tree with the common bile duct dilation of 10-14 mm with milder intrahepatic biliary dilation.  There was wasting versus extrinsic compression of the distal common bile duct.  No obvious filling defect or high-grade stricture was seen.  Given the clinical scenario and the dilation of the biliary tree, I elected to perform a biliary sphincterotomy.  The safety wire was placed deep in the biliary tree.  Sphincterotome was pulled back across the ampullary orifice at the 12 o'clock position a 1 cm biliary sphincterotomy was performed with ERBE unit without bleeding or other apparent complication,.  Subsequently, the sphincterotome was railed off and a graduated balloon was railed on to the confluence and it was inflated to accommodate the size of the bile duct.  A balloon occlusion cholangiogram was performed. With this maneuver, a large bolus of "sludge" was delivered through the ampullary orifice.  The balloon pulled through the area of distal bile duct wasting or narrowing without any unusual resistance.  I subsequently repeated the balloon dredging x2 without further recovery of any stone material.  There appeared to be fairly good drainage of the biliary tree with this maneuver.  I further inspected the abnormality involving the distal bile duct by changing the patient's position on the table and asked Dr. Tyron Russell, the radiologist to come into the room.  He reconciled the CT with the real time cholangiogram images.  The wasting appears to be asymmetrical.  It appears to be smooth.  There is a blood vessel very closely located to the area of wasting on the cholangiogram seen on CT. This area is  also in the head of the pancreas.  A polyp or small neoplasm not excluded at this time.  Given the patient's presentation, I elected to go ahead and place a biliary stent to insure biliary decompression and drainage.  I railed off the graduated extractor balloon and railed on a stent guiding system and advanced a 10-French, 5 cm between flaps, plastic stent under fluoroscopic control.  The stent was placed in excellent position.  There appeared to be good drainage after this maneuver.  The patient tolerated the procedure well and was taken to PACU in stable condition.  The pancreatic duct was not manipulated or injected with contrast.  IMPRESSION: 1. Diffusely dilated biliary tree with focal narrowing of the distal     common bile duct of uncertain significance. 2. Status post biliary sphincterotomy and balloon dredging with     recovery of sludge material as described above. 3. Status post placement of a 10-French, 5 cm biliary stent.  RECOMMENDATIONS:  Continue antibiotics for now, clear liquid diet initially and then advance as tolerated.  Plan for this gentleman to return in several weeks for further assessment of the bile duct abnormality.  It may be best for him to have an EUS and stent change/removal in [place].  I have discussed my findings and recommendations with the patient's sons at length.  He will be reassessed by Dr. Darrick Penna tomorrow.     Jonathon Bellows, MD [facility]     RMR/MEDQ  D:  12/25/2012  T:  12/25/2012  Job:  [id number]  cc:   Delaney Meigs, M.D. Fax: [phone]

## 2012-12-25 NOTE — Anesthesia Preprocedure Evaluation (Signed)
Anesthesia Evaluation  Patient identified by MRN, date of birth, ID band Patient awake    Reviewed: Allergy & Precautions, H&P , NPO status , Patient's Chart, lab work & pertinent test results  Airway Mallampati: I TM Distance: >3 FB     Dental  (+) Edentulous Upper and Edentulous Lower   Pulmonary former smoker,  breath sounds clear to auscultation        Cardiovascular hypertension, + CAD, + Cardiac Stents and + Peripheral Vascular Disease (femoral stent) + dysrhythmias Supra Ventricular Tachycardia Rhythm:Regular Rate:Normal     Neuro/Psych Intrathecal baclofen pump Hx carotid endart.     GI/Hepatic GERD-  Medicated and Controlled,  Endo/Other    Renal/GU      Musculoskeletal   Abdominal   Peds  Hematology   Anesthesia Other Findings   Reproductive/Obstetrics                           Anesthesia Physical Anesthesia Plan  ASA: III  Anesthesia Plan: General   Post-op Pain Management:    Induction: Intravenous, Rapid sequence and Cricoid pressure planned  Airway Management Planned: Oral ETT  Additional Equipment:   Intra-op Plan:   Post-operative Plan: Extubation in OR  Informed Consent: I have reviewed the patients History and Physical, chart, labs and discussed the procedure including the risks, benefits and alternatives for the proposed anesthesia with the patient or authorized representative who has indicated his/her understanding and acceptance.     Plan Discussed with:   Anesthesia Plan Comments:         Anesthesia Quick Evaluation

## 2012-12-25 NOTE — Care Management Note (Unsigned)
    Page 1 of 1   12/25/2012     4:08:19 PM   CARE MANAGEMENT NOTE 12/25/2012  Patient:  [name], [name]   Account Number:  [id number]  Date Initiated:  12/25/2012  Documentation initiated by:  Rosemary Holms  Subjective/Objective Assessment:   Pt lives at home with his son, Archimedes. States that at this time he does not anticipate needs and has all the DME he needs.     Action/Plan:   Anticipated DC Date:  12/26/2012   Anticipated DC Plan:  HOME/SELF CARE      DC Planning Services  CM consult      Choice offered to / List presented to:             Status of service:  In process, will continue to follow Medicare Important Message given?   (If response is "NO", the following Medicare IM given date fields will be blank) Date Medicare IM given:   Date Additional Medicare IM given:    Discharge Disposition:    Per UR Regulation:    If discussed at Long Length of Stay Meetings, dates discussed:    Comments:  12/25/12 Rosemary Holms RN BSN CM

## 2012-12-25 NOTE — Progress Notes (Signed)
Lovenox ordered yesterday evening for DVT prophylaxis; this was held per night shift nurse due to upcoming ERCP. No Lovenox given. Next dose due at 2200 this evening. Continue with plans for ERCP today with Dr. Jena Gauss.

## 2012-12-25 NOTE — Anesthesia Procedure Notes (Signed)
Procedure Name: Intubation Date/Time: 12/25/2012 1:42 PM Performed by: Carolyne Littles, Shaheen Star L Pre-anesthesia Checklist: Patient identified, Patient being monitored, Timeout performed, Emergency Drugs available and Suction available Patient Re-evaluated:Patient Re-evaluated prior to inductionOxygen Delivery Method: Circle System Utilized Preoxygenation: Pre-oxygenation with 100% oxygen Intubation Type: IV induction, Rapid sequence and Cricoid Pressure applied Laryngoscope Size: 3 and Miller Grade View: Grade I Tube type: Oral Tube size: 7.0 mm Number of attempts: 1 Airway Equipment and Method: stylet Placement Confirmation: ETT inserted through vocal cords under direct vision,  positive ETCO2 and breath sounds checked- equal and bilateral Secured at: 21 cm Tube secured with: Tape Dental Injury: Teeth and Oropharynx as per pre-operative assessment

## 2012-12-26 ENCOUNTER — Inpatient Hospital Stay (HOSPITAL_COMMUNITY): Payer: Medicare Other

## 2012-12-26 DIAGNOSIS — K8309 Other cholangitis: Secondary | ICD-10-CM

## 2012-12-26 DIAGNOSIS — E876 Hypokalemia: Secondary | ICD-10-CM | POA: Diagnosis not present

## 2012-12-26 DIAGNOSIS — R042 Hemoptysis: Secondary | ICD-10-CM

## 2012-12-26 LAB — HEPATIC FUNCTION PANEL
ALT: 37 U/L (ref 0–53)
AST: 37 U/L (ref 0–37)
Albumin: 2 g/dL — ABNORMAL LOW (ref 3.5–5.2)
Alkaline Phosphatase: 271 U/L — ABNORMAL HIGH (ref 39–117)
Total Protein: 5.6 g/dL — ABNORMAL LOW (ref 6.0–8.3)

## 2012-12-26 LAB — FERRITIN: Ferritin: 478 ng/mL — ABNORMAL HIGH (ref 22–322)

## 2012-12-26 LAB — IRON AND TIBC
Iron: 64 ug/dL (ref 42–135)
TIBC: 213 ug/dL — ABNORMAL LOW (ref 215–435)
UIBC: 149 ug/dL (ref 125–400)

## 2012-12-26 LAB — RETICULOCYTES
RBC.: 3.38 MIL/uL — ABNORMAL LOW (ref 4.22–5.81)
Retic Count, Absolute: 47.3 10*3/uL (ref 19.0–186.0)

## 2012-12-26 LAB — CBC
HCT: 29.9 % — ABNORMAL LOW (ref 39.0–52.0)
Hemoglobin: 10.1 g/dL — ABNORMAL LOW (ref 13.0–17.0)
MCH: 29.8 pg (ref 26.0–34.0)
MCHC: 33.8 g/dL (ref 30.0–36.0)
MCV: 88.2 fL (ref 78.0–100.0)
RDW: 15.4 % (ref 11.5–15.5)

## 2012-12-26 LAB — BASIC METABOLIC PANEL
CO2: 25 mEq/L (ref 19–32)
GFR calc Af Amer: >90 mL/min (ref >90)
Glucose, Bld: 81 mg/dL (ref 70–99)

## 2012-12-26 LAB — LIPASE, BLOOD: Lipase: 92 U/L — ABNORMAL HIGH (ref 11–59)

## 2012-12-26 MED ORDER — FUROSEMIDE 10 MG/ML IJ SOLN
20.0000 mg | Freq: Once | INTRAMUSCULAR | Status: AC
  Administered 2012-12-26: 20 mg via INTRAVENOUS
  Filled 2012-12-26: qty 2

## 2012-12-26 MED ORDER — POTASSIUM CHLORIDE CRYS ER 20 MEQ PO TBCR
40.0000 meq | EXTENDED_RELEASE_TABLET | Freq: Once | ORAL | Status: AC
  Administered 2012-12-26: 40 meq via ORAL
  Filled 2012-12-26: qty 2

## 2012-12-26 MED ORDER — PANTOPRAZOLE SODIUM 40 MG PO TBEC
40.0000 mg | DELAYED_RELEASE_TABLET | Freq: Every day | ORAL | Status: DC
  Administered 2012-12-26 – 2012-12-27 (×2): 40 mg via ORAL
  Filled 2012-12-26 (×2): qty 1

## 2012-12-26 NOTE — Anesthesia Postprocedure Evaluation (Signed)
  Anesthesia Post-op Note  Patient: [name]  Procedure(s) Performed: Procedure(s): ENDOSCOPIC RETROGRADE CHOLANGIOPANCREATOGRAPHY (ERCP) (N/A) SPHINCTEROTOMY (N/A) BILIARY STENT PLACEMENT (N/A) REMOVAL OF STONES (N/A)  Patient Location: Nursing Unit  Anesthesia Type:General  Level of Consciousness: awake, alert , oriented and patient cooperative  Airway and Oxygen Therapy: Patient Spontanous Breathing  Post-op Pain: none  Post-op Assessment: Post-op Vital signs reviewed, Patient's Cardiovascular Status Stable, Respiratory Function Stable, Patent Airway and Pain level controlled  Post-op Vital Signs: Reviewed and stable  Complications: No apparent anesthesia complications Patient reports some bloody sputum. Denies vomiting blood,  shortness of  Breathe.

## 2012-12-26 NOTE — Progress Notes (Signed)
[facility] PROGRESS NOTE  [name] WUJ:[id number] DOB: [birth year] DOA: 12/24/2012 PCP: Josue Hector, MD  Assessment/Plan:  Acute cholangitis: Etiology unclear. Patient had cholecystectomy in 2010. S/P ERCP 12/25/12 yielding dilated biliary tree with focal narrowing of the distal common bile duct. S/P biliary sphincterotomy and balloon dredging with recovery of sludge and stent placement.  Looks well this am. LFT's  Continuing to trend down. Total bili trending down. Denies pain/nause. Tolerating diet Appreciate GI assistance. Continue protonix  daily. Zosyn day #3.  Active Problems:  Pancreatitis: Related to #1. See #1. Continue supportive therapy in the form of IV hydration, antiemetic, analgesia. Lipase 92 down from >2000 2 days ago.   Leukocytosis: Resolved.    Hyponatremia: Likely related to #1 and #2.Marland Kitchen Resolved  Hypokalemia: likely related to #1. Will replete and recheck.    Jaundice: Secondary to #1. Total bilirubin trending downward. Continue to monitor.  Blood tinged sputum: likely minor trauma secondary ERCP. Small amount. Platelets/INR/Hg all within the limits of normal. Probably self limiting. Will monitor   Anemia: Likely due to chronic disease. Stable. Chart review indicates 2 years ago hemoglobin was 14.6. Will check an anemia panel. Will check FOBT   Coronary artery disease: No chest pain. Patient is on aspirin and Crestor at home. Will hold these medications for now due to #1   Hypertension: Fair control. SBP range 103-144. Patient is on Cardizem and lisinopril at home. Will resume cardizem. Will monitor closely   GERD (gastroesophageal reflux disease): We'll continue PPI as patient takes Nexium at home.    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 He goes to the ALS clinic at [facility] in [place].   Esophagitis: Per CT. Likely related to ongoing GERD. Will continue PPI.   Diverticulosis: Per CT. Patient denies  any melena or bright red blood per rectum.   Spondylolisthesis at L5-S1 level s/p fusion 2010. Possible loose pedicle screw per CT. Will need OP follow up with neurosurgery   Code Status: full Family Communication: son at bedside Disposition Plan: home when ready hopefully tomorrow   Consultants:  GI  Procedures:  ERCP 12/25/12  Antibiotics:  Zosyn 12/24/12>>  HPI/Subjective: Sitting up in chair smiling. Denies pain/nausea. Reports small amount blood tinged sputum.   Objective: Filed Vitals:   12/26/12 0411  BP: 103/64  Pulse: 59  Temp: 98.3 F (36.8 C)  Resp: 18    Intake/Output Summary (Last 24 hours) at 12/26/12 0910 Last data filed at 12/26/12 0211  Gross per 24 hour  Intake    540 ml  Output    550 ml  Net    -10 ml   Filed Weights   12/24/12 1756  Weight: 70.3 kg (154 lb 15.7 oz)    Exam:   General:  Well nourished, jaundiced  Cardiovascular: RRR No MGR No LE edema  Respiratory: Normal effort. BS clear bilaterally no wheeze no rhonchi  Abdomen: round, soft +BS non-tender to palpation  Musculoskeletal: no clubbing no cyanosis   Data Reviewed: Basic Metabolic Panel:  Recent Labs Lab 12/24/12 1053 12/25/12 0557 12/26/12 0530  NA 132* 136 138  K 3.7 3.4* 3.3*  CL 94* 102 103  CO2 26 25 25   GLUCOSE 117* 67* 81  BUN 19 17 13   CREATININE 1.12 0.97 0.85  CALCIUM 9.7 9.3 9.2   Liver Function Tests:  Recent Labs Lab 12/24/12 1053 12/25/12 0557 12/26/12 0530  AST 71* 46* 37  ALT 66* 45 37  ALKPHOS 391* 308*  271*  BILITOT 10.6* 9.2* 8.5*  PROT 6.9 5.8* 5.6*  ALBUMIN 2.7* 2.1* 2.0*    Recent Labs Lab 12/24/12 1105 12/26/12 0530  LIPASE 2136* 92*   No results found for this basename: AMMONIA,  in the last 168 hours CBC:  Recent Labs Lab 12/24/12 1053 12/25/12 0557 12/26/12 0530  WBC 17.6* 9.4 6.6  NEUTROABS 16.8*  --   --   HGB 11.8* 10.9* 10.1*  HCT 34.2* 31.9* 29.9*  MCV 87.2 87.6 88.2  PLT 319 286 293   Cardiac  Enzymes: No results found for this basename: CKTOTAL, CKMB, CKMBINDEX, TROPONINI,  in the last 168 hours BNP (last 3 results) No results found for this basename: PROBNP,  in the last 8760 hours CBG: No results found for this basename: GLUCAP,  in the last 168 hours  No results found for this or any previous visit (from the past 240 hour(s)).   Studies: Ct Abdomen Pelvis W Contrast  12/24/2012   CLINICAL DATA:  Tremors. Jaundice. Body aches. coronary artery disease. Right upper quadrant abdominal pain. Leukocytosis.  EXAM: CT ABDOMEN AND PELVIS WITH CONTRAST  TECHNIQUE: Multidetector CT imaging of the abdomen and pelvis was performed using the standard protocol following bolus administration of intravenous contrast.  CONTRAST:  OMNIPAQUE IOHEXOL 300 MG/ML  SOLN  COMPARISON:  12/06/2010  FINDINGS: Coronary and aortic atherosclerosis noted. Wall thickening in the distal esophagus noted with small distal paraesophageal lymph nodes. Most of these nodes are small, although 1 node with a fatty hilum measures up to 0.9 cm in short axis on image 8 of series 2. Peripheral fibrosis noted at the lung bases.  Noncalcified pleural plaques are present along both hemidiaphragms. These are similar to the prior exam.  Stable punctate hepatic calcifications are likely from remote granulomatous disease. The gallbladder surgically absent. There is greater than expected thickening and enhancement in the common hepatic duct and common bile duct wall. CBD luminal caliber proximally 7 mm. No well-defined filling defect.  Fatty pancreas noted without dilatation of the dorsal pancreatic duct. Spleen and adrenal glands normal. No significant renal or ureteral abnormality observed. Low-level stranding in the porta hepatis noted.  Appendix normal. Sigmoid diverticulosis without active diverticulitis. No overtly dilated small bowel noted. There is a diverticulum of the transverse duodenum just cephalad to the transverse duodenum.   Aortoiliac atherosclerotic calcification noted. No pathologic pelvic adenopathy is observed.  Urinary bladder unremarkable. Small density in the right stress that small density along the right inguinal canal, potentially a small amount of fluid along in inguinal hernia otherwise containing adipose tissue. The  A pump is noted with catheter extending up to the T9 level in the dorsal spinal canal. This posterolateral rod and pedicle screw fixation at L4-5 bilaterally stress that there is posterolateral rod and pedicle screw formation at L5-S1 bilaterally, with the left L5 pedicle screw capturing the superior endplate and with potential lucency along the threading of this screw.  There is mild omental stranding of uncertain significance.  Stable appearance of chronic superior endplate compression fracture T12.  IMPRESSION: 1. Abnormal degree of thickening and enhancement of the common bile duct and common hepatic duct walls, with surrounding stranding in the porta hepatis. Appearance raises suspicion for cholangitis. 2. Distal esophageal wall thickening, suspicious for esophagitis. 3. Mild omental stranding of uncertain significance. 4. Small right inguinal hernia contains a small amount of fluid density which is new. 5. Noncalcified stable pleural plaques along the hemidiaphragms. 6. Peripheral fibrosis in the lung  bases. 7. Sigmoid diverticulosis. 8. The left L5 pedicle screw captures the superior endplate and has lucency along its threading, and accordingly may be loose.   Electronically Signed   By: Herbie Baltimore   On: 12/24/2012 13:23   Dg Chest Portable 1 View  12/24/2012   CLINICAL DATA:  Tremors. Jaundice. Generalized body aches. History of hypertension, smoking.  EXAM: PORTABLE CHEST - 1 VIEW  COMPARISON:  12/06/2010  FINDINGS: The heart is enlarged. There is prominence of interstitial markings, increased since the prior study. Findings may be related to interstitial edema, infection or developing  interstitial lung disease. No focal consolidations are identified.  IMPRESSION: 1. Cardiomegaly and prominent interstitial lung markings, favoring edema. 2. Infection or chronic lung disease are also considerations.   Electronically Signed   By: Rosalie Gums M.D.   On: 12/24/2012 11:16   Dg Ercp Biliary & Pancreatic Ducts  12/25/2012   CLINICAL DATA:  Elevated LFTs, jaundice, abnormal CT with enhancing walls of the CBD question cholangitis and biliary obstruction  EXAM: ERCP WITH SPHINCTEROTOMY  TECHNIQUE: Multiple spot images obtained with the fluoroscopic device and submitted for interpretation post-procedure.  COMPARISON:  CT abdomen and pelvis 12/24/2012  FINDINGS: Common bile duct and common hepatic duct appear dilated estimated 10-14 mm diameter.  Intrahepatic biliary radicles minimally dilated.  Post cholecystectomy.  Few air bubbles noted within the CBD.  A focal area of narrowing is seen at the distal CBD, smoothly and linear in orientation.  This persisted on multiple series.  No other persistent intraluminal filling defects.  Sludge balls were received at time of balloon catheter passage during the procedure.  Walls of the biliary tree appears smooth without irregularity.  CBD stent was placed.  Adequate drainage of contrast into the duodenum following the procedure.  IMPRESSION: Distal CBD stricture, smooth, uncertain if represents a focal stricture, web, or extrinsic compression ; mass not completely excluded but the smooth linear nature of the finding makes this less likely.  Mild intrahepatic and extrahepatic biliary dilatation without definite visualization of a gallstone.  These images were submitted for radiologic interpretation only. Please see the procedural report for the amount of contrast and the fluoroscopy time utilized.   Electronically Signed   By: Ulyses Southward M.D.   On: 12/25/2012 15:43    Scheduled Meds: . enoxaparin (LOVENOX) injection  40 mg Subcutaneous Q24H  . pantoprazole  (PROTONIX) IV  40 mg Intravenous Q24H  . piperacillin-tazobactam (ZOSYN)  IV  3.375 g Intravenous Q8H  . potassium chloride  40 mEq Oral Once  . rOPINIRole  1 mg Oral QHS  . sodium chloride  3 mL Intravenous Q12H   Continuous Infusions: . dextrose 5 % and 0.45 % NaCl with KCl 20 mEq/L 75 mL/hr at 12/25/12 1610    Principal Problem:   Acute cholangitis Active Problems:   Coronary artery disease   Hypertension   GERD (gastroesophageal reflux disease)   Primary lateral sclerosis   Spondylolisthesis at L5-S1 level   Pancreatitis   Leukocytosis   Hyponatremia   Jaundice   Anemia   Diverticulosis   Right inguinal hernia   Esophagitis   HOH (hard of hearing)   Hypokalemia    Time spent: 30 minutes    [facility] M  [facility] Pager [phone]. If 7PM-7AM, please contact night-coverage at [email or web address], password [facility] 12/26/2012, 9:10 AM  LOS: 2 days

## 2012-12-26 NOTE — Progress Notes (Signed)
Subjective:  Feels better. Some abdominal pain only when pushed on. Tolerating diet. Complains of blood tinged sputum since procedure on several episodes. No BM. Wants to go home.  Objective: Vital signs in last 24 hours: Temp:  [97.4 F (36.3 C)-98.3 F (36.8 C)] 98.3 F (36.8 C) (09/25 0411) Pulse Rate:  [59-97] 59 (09/25 0411) Resp:  [13-34] 18 (09/25 0411) BP: (103-161)/(56-82) 103/64 mmHg (09/25 0411) SpO2:  [93 %-100 %] 94 % (09/25 0411) Last BM Date: 12/25/12 General:   Alert,  Well-developed, well-nourished, pleasant and cooperative in NAD. Accompanied by brother-in-law. Head:  Normocephalic and atraumatic. Eyes:  Sclera clear, positive icterus  Abdomen:  Soft, mild epigastric tenderness and nondistended.Normal bowel sounds, without guarding, and without rebound.   Extremities:  Without clubbing, deformity. 1+ lower extremity edema bilaterally. Neurologic:  Alert and  oriented x4;  grossly normal neurologically. Skin:  Intact without significant lesions or rashes. Psych:  Alert and cooperative. Normal mood and affect.  Intake/Output from previous day: 09/24 0701 - 09/25 0700 In: 540 [P.O.:240; I.V.:300] Out: 550 [Urine:550] Intake/Output this shift:    Lab Results: CBC  Recent Labs  12/24/12 1053 12/25/12 0557 12/26/12 0530  WBC 17.6* 9.4 6.6  HGB 11.8* 10.9* 10.1*  HCT 34.2* 31.9* 29.9*  MCV 87.2 87.6 88.2  PLT 319 286 293   BMET  Recent Labs  12/24/12 1053 12/25/12 0557 12/26/12 0530  NA 132* 136 138  K 3.7 3.4* 3.3*  CL 94* 102 103  CO2 26 25 25   GLUCOSE 117* 67* 81  BUN 19 17 13   CREATININE 1.12 0.97 0.85  CALCIUM 9.7 9.3 9.2   LFTs  Recent Labs  12/24/12 1053 12/25/12 0557 12/26/12 0530  BILITOT 10.6* 9.2* 8.5*  BILIDIR 8.5*  --  7.1*  IBILI  --   --  1.4*  ALKPHOS 391* 308* 271*  AST 71* 46* 37  ALT 66* 45 37  PROT 6.9 5.8* 5.6*  ALBUMIN 2.7* 2.1* 2.0*    Recent Labs  12/24/12 1105 12/26/12 0530  LIPASE 2136* 92*    PT/INR  Recent Labs  12/24/12 1105  LABPROT 13.9  INR 1.09      Imaging Studies: Ct Abdomen Pelvis W Contrast  12/24/2012   CLINICAL DATA:  Tremors. Jaundice. Body aches. coronary artery disease. Right upper quadrant abdominal pain. Leukocytosis.  EXAM: CT ABDOMEN AND PELVIS WITH CONTRAST  TECHNIQUE: Multidetector CT imaging of the abdomen and pelvis was performed using the standard protocol following bolus administration of intravenous contrast.  CONTRAST:  OMNIPAQUE IOHEXOL 300 MG/ML  SOLN  COMPARISON:  12/06/2010  FINDINGS: Coronary and aortic atherosclerosis noted. Wall thickening in the distal esophagus noted with small distal paraesophageal lymph nodes. Most of these nodes are small, although 1 node with a fatty hilum measures up to 0.9 cm in short axis on image 8 of series 2. Peripheral fibrosis noted at the lung bases.  Noncalcified pleural plaques are present along both hemidiaphragms. These are similar to the prior exam.  Stable punctate hepatic calcifications are likely from remote granulomatous disease. The gallbladder surgically absent. There is greater than expected thickening and enhancement in the common hepatic duct and common bile duct wall. CBD luminal caliber proximally 7 mm. No well-defined filling defect.  Fatty pancreas noted without dilatation of the dorsal pancreatic duct. Spleen and adrenal glands normal. No significant renal or ureteral abnormality observed. Low-level stranding in the porta hepatis noted.  Appendix normal. Sigmoid diverticulosis without active diverticulitis. No overtly dilated small  bowel noted. There is a diverticulum of the transverse duodenum just cephalad to the transverse duodenum.  Aortoiliac atherosclerotic calcification noted. No pathologic pelvic adenopathy is observed.  Urinary bladder unremarkable. Small density in the right stress that small density along the right inguinal canal, potentially a small amount of fluid along in inguinal  hernia otherwise containing adipose tissue. The  A pump is noted with catheter extending up to the T9 level in the dorsal spinal canal. This posterolateral rod and pedicle screw fixation at L4-5 bilaterally stress that there is posterolateral rod and pedicle screw formation at L5-S1 bilaterally, with the left L5 pedicle screw capturing the superior endplate and with potential lucency along the threading of this screw.  There is mild omental stranding of uncertain significance.  Stable appearance of chronic superior endplate compression fracture T12.  IMPRESSION: 1. Abnormal degree of thickening and enhancement of the common bile duct and common hepatic duct walls, with surrounding stranding in the porta hepatis. Appearance raises suspicion for cholangitis. 2. Distal esophageal wall thickening, suspicious for esophagitis. 3. Mild omental stranding of uncertain significance. 4. Small right inguinal hernia contains a small amount of fluid density which is new. 5. Noncalcified stable pleural plaques along the hemidiaphragms. 6. Peripheral fibrosis in the lung bases. 7. Sigmoid diverticulosis. 8. The left L5 pedicle screw captures the superior endplate and has lucency along its threading, and accordingly may be loose.   Electronically Signed   By: Herbie Baltimore   On: 12/24/2012 13:23   Dg Chest Portable 1 View  12/24/2012   CLINICAL DATA:  Tremors. Jaundice. Generalized body aches. History of hypertension, smoking.  EXAM: PORTABLE CHEST - 1 VIEW  COMPARISON:  12/06/2010  FINDINGS: The heart is enlarged. There is prominence of interstitial markings, increased since the prior study. Findings may be related to interstitial edema, infection or developing interstitial lung disease. No focal consolidations are identified.  IMPRESSION: 1. Cardiomegaly and prominent interstitial lung markings, favoring edema. 2. Infection or chronic lung disease are also considerations.   Electronically Signed   By: Rosalie Gums M.D.   On:  12/24/2012 11:16   Dg Ercp Biliary & Pancreatic Ducts  12/25/2012   CLINICAL DATA:  Elevated LFTs, jaundice, abnormal CT with enhancing walls of the CBD question cholangitis and biliary obstruction  EXAM: ERCP WITH SPHINCTEROTOMY  TECHNIQUE: Multiple spot images obtained with the fluoroscopic device and submitted for interpretation post-procedure.  COMPARISON:  CT abdomen and pelvis 12/24/2012  FINDINGS: Common bile duct and common hepatic duct appear dilated estimated 10-14 mm diameter.  Intrahepatic biliary radicles minimally dilated.  Post cholecystectomy.  Few air bubbles noted within the CBD.  A focal area of narrowing is seen at the distal CBD, smoothly and linear in orientation.  This persisted on multiple series.  No other persistent intraluminal filling defects.  Sludge balls were received at time of balloon catheter passage during the procedure.  Walls of the biliary tree appears smooth without irregularity.  CBD stent was placed.  Adequate drainage of contrast into the duodenum following the procedure.  IMPRESSION: Distal CBD stricture, smooth, uncertain if represents a focal stricture, web, or extrinsic compression ; mass not completely excluded but the smooth linear nature of the finding makes this less likely.  Mild intrahepatic and extrahepatic biliary dilatation without definite visualization of a gallstone.  These images were submitted for radiologic interpretation only. Please see the procedural report for the amount of contrast and the fluoroscopy time utilized.   Electronically Signed   By:  Ulyses Southward M.D.   On: 12/25/2012 15:43  [2 weeks]   Assessment: Cholangitis s/p ERCP yesterday. Diffusely dilated biliary tree with focal narrowing of distal CBD. S/P biliary spincterotomy and balloon dredging and biliary stent placement. LFTs improving. Lipase down from 2100 to normal <100.Clinically patient feels much better. He has been afebrile the last 24 hours. Blood-tinged sputum likely related  to trauma from procedure yesterday. Attending aware.   Plan: 1. EUS and stent change/removal in [place] in a few weeks to further assess bile duct abnormality. 2. Stop Lovenox given recent sphincterotomy and hemoptysis.  3. I spoke with Minerva Areola, CRNA regarding hemoptysis and she will reevaluate him. 4. SCDs.   LOS: 2 days   Tana Coast  12/26/2012, 8:05 AM

## 2012-12-26 NOTE — Progress Notes (Addendum)
Patient states he feels well, has some persistent soreness of the right upper quadrant.  Overnight, he started coughing up bloody sputum.  He has coughed up bloody sputum about 8 times, he states.  Exam reveals bilateral wheezes, RRR, and persistent TTP RUQ without rebound or guarding.  Labs demonstrated resolved leukocytosis, stable hemoglobin, and improved LFTs. Telemetry demonstrated increase in HR from baseline in the 70s to the low 100s, however, this was transient.  ECG demonstrated NSR.  CXR demonstrated vascular congestion and bilateral opacifications which could represent edema, infiltrate, or atelectasis.  Most likely this is related to atelectasis and fluid.  Discontinued IV fluids and have ordered 1 dose of lasix 20mg  IV.  Hemoptysis is likely secondary to trauma from intubation as no evidence of hypoxia or persistent tachycardia to suggest PE.  Agree with continued antibiotics for his cholangitis.  Likely discharge to home tomorrow.

## 2012-12-26 NOTE — Anesthesia Postprocedure Evaluation (Signed)
  Anesthesia Post-op Note  Patient: [name]  Procedure(s) Performed: Procedure(s): ENDOSCOPIC RETROGRADE CHOLANGIOPANCREATOGRAPHY (ERCP) (N/A) SPHINCTEROTOMY (N/A) BILIARY STENT PLACEMENT (N/A) REMOVAL OF STONES (N/A)  Patient Location: Nursing Unit  Anesthesia Type:General  Level of Consciousness: awake and patient cooperative  Airway and Oxygen Therapy: Patient Spontanous Breathing  Post-op Pain: none  Post-op Assessment: Post-op Vital signs reviewed, Patient's Cardiovascular Status Stable, Respiratory Function Stable, No signs of Nausea or vomiting and Pain level controlled  Post-op Vital Signs: Reviewed and stable  Complications: No apparent anesthesia complications

## 2012-12-26 NOTE — Progress Notes (Signed)
REVIEWED.  

## 2012-12-27 ENCOUNTER — Telehealth: Payer: Self-pay | Admitting: Gastroenterology

## 2012-12-27 ENCOUNTER — Encounter (HOSPITAL_COMMUNITY): Payer: Self-pay | Admitting: Internal Medicine

## 2012-12-27 ENCOUNTER — Other Ambulatory Visit: Payer: Self-pay

## 2012-12-27 ENCOUNTER — Other Ambulatory Visit: Payer: Self-pay | Admitting: Gastroenterology

## 2012-12-27 DIAGNOSIS — D72829 Elevated white blood cell count, unspecified: Secondary | ICD-10-CM

## 2012-12-27 DIAGNOSIS — K8309 Other cholangitis: Secondary | ICD-10-CM

## 2012-12-27 DIAGNOSIS — K839 Disease of biliary tract, unspecified: Secondary | ICD-10-CM

## 2012-12-27 DIAGNOSIS — R945 Abnormal results of liver function studies: Secondary | ICD-10-CM

## 2012-12-27 DIAGNOSIS — K219 Gastro-esophageal reflux disease without esophagitis: Secondary | ICD-10-CM

## 2012-12-27 DIAGNOSIS — K859 Acute pancreatitis without necrosis or infection, unspecified: Secondary | ICD-10-CM

## 2012-12-27 DIAGNOSIS — I471 Supraventricular tachycardia: Secondary | ICD-10-CM | POA: Diagnosis not present

## 2012-12-27 LAB — BASIC METABOLIC PANEL
BUN: 12 mg/dL (ref 6–23)
Creatinine, Ser: 1.12 mg/dL (ref 0.50–1.35)
GFR calc Af Amer: 72 mL/min — ABNORMAL LOW (ref >90)
GFR calc non Af Amer: 62 mL/min — ABNORMAL LOW (ref >90)
Potassium: 3.2 mEq/L — ABNORMAL LOW (ref 3.5–5.1)
Sodium: 139 mEq/L (ref 135–145)

## 2012-12-27 LAB — HEPATIC FUNCTION PANEL
Albumin: 2.1 g/dL — ABNORMAL LOW (ref 3.5–5.2)
Bilirubin, Direct: 4.8 mg/dL — ABNORMAL HIGH (ref 0.0–0.3)
Indirect Bilirubin: 1.3 mg/dL — ABNORMAL HIGH (ref 0.3–0.9)
Total Bilirubin: 6.1 mg/dL — ABNORMAL HIGH (ref 0.3–1.2)

## 2012-12-27 LAB — CBC
HCT: 30.7 % — ABNORMAL LOW (ref 39.0–52.0)
MCHC: 34.2 g/dL (ref 30.0–36.0)
MCV: 88 fL (ref 78.0–100.0)
RDW: 15.3 % (ref 11.5–15.5)

## 2012-12-27 MED ORDER — AMOXICILLIN-POT CLAVULANATE 875-125 MG PO TABS: 1.0000 | ORAL_TABLET | Freq: Two times a day (BID) | ORAL | Status: DC

## 2012-12-27 MED ORDER — POTASSIUM CHLORIDE CRYS ER 20 MEQ PO TBCR
40.0000 meq | EXTENDED_RELEASE_TABLET | Freq: Every day | ORAL | Status: DC
  Administered 2012-12-27: 40 meq via ORAL
  Filled 2012-12-27: qty 2

## 2012-12-27 NOTE — Telephone Encounter (Signed)
Lab order done. Pt is still inpatient at [facility].

## 2012-12-27 NOTE — Telephone Encounter (Signed)
Please arrange for LFTs on Wednesday. Needs EUS with Dr. Christella Hartigan in few weeks to evaluate abnormal bile duct and stent change/removal.

## 2012-12-27 NOTE — Discharge Summary (Signed)
Patient states he feels well.  Abdominal soreness improving.  Cough improved and less bloody sputum after lasix ye.   Had BM yesterday.    GEN:  Mildly jaundiced CM, no acute distress HEENT:  Scleral icterus CV:  RRR PULM:  CTAB ABD:  NABS, soft, ND, mild TTP RUQ MSK:  No LEE  Acute cholangitis, resolving.  Patient to follow up with gastroenterology and primary care doctor.  Will give additional augmentin to complete a 10-day course.  Agree with rest of assessment and plan per Toya Smothers.

## 2012-12-27 NOTE — Telephone Encounter (Signed)
Referral has been made to Dr. Jacobs 

## 2012-12-27 NOTE — Progress Notes (Signed)
Discharge instructions given on medications and follow up visits,patient verbalized understanding.Prescriptions sent with patient,family at bedside.No c/o pain or discomfort noted.Accompanied by staff to an awaiting vehicle.

## 2012-12-27 NOTE — Discharge Summary (Signed)
Physician Discharge Summary  [name]:[id number] DOB: 02-27-1936 DOA: 12/24/2012  PCP: Josue Hector, MD  Admit date: 12/24/2012 Discharge date: 12/27/2012  Time spent: 40 minutes minutes  Recommendations for Outpatient Follow-up:  1. PCP in 1 week for evaluation of symptoms. Will need CMET to evaluate liver function 2. Dr. Christella Hartigan with GI in [place] will contact patient for OP follow up appointment and EUS to evaluate abnormal bile duct and stent change/removal.  Discharge Diagnoses:  Principal Problem:   Acute cholangitis Active Problems:   Coronary artery disease   Hypertension   GERD (gastroesophageal reflux disease)   Primary lateral sclerosis   Spondylolisthesis at L5-S1 level   Pancreatitis   Leukocytosis   Hyponatremia   Jaundice   Anemia   Diverticulosis   Right inguinal hernia   Esophagitis   HOH (hard of hearing)   Hypokalemia   Blood-tinged sputum   Paroxysmal supraventricular tachycardia   Discharge Condition: medically stable and ready for discharge  Diet recommendation: regular  Filed Weights   12/24/12 1756  Weight: 70.3 kg (154 lb 15.7 oz)    History of present illness:  [name] is a 77 y.o. male with past medical history that includes hypertension, GERD, coronary artery disease, primary lateral sclerosis, presented to the emergency department on 12/24/12 with chief complaint of abdominal pain. He indicated that he had suffered from abdominal pain off and on for the previous 6 weeks. He stated that the pain was mostly in his lower quadrants and radiated to both hips and down both legs. He stated that he has had low back pain for many years and that this pain was different from that pain. He described the pain as sharp constant rated it a 6/10 at worst. He denied nausea vomiting fever or chills. He denied any constipation diarrhea melena or hematochezia. He indicated that he had been eating and drinking his normal amount and had  suffered from no unintentional weight loss. He also reported that he had a cholecystectomy in 2010 and he also currently has a baclofen pump located in the right lower quadrant. He denied any chest pain palpitation shortness of breath headache visual disturbances. Initial evaluation in the emergency department yielded lab work significant for sodium of 132 chloride 94 GFR 62 glucose 117 alkaline phosphatase alignment phosphate 391, lipase 2136, AST 71, ALT 66, total bilirubin 10.6. In addition his white count was 17.6 hemoglobin 11.8. Urinalysis was significant for hyalin casts 30 protein large bilirubin and brown in color. CT of the abdomen yielded: Abnormal degree of thickening and enhancement of the common bile duct and common hepatic duct walls, with surrounding stranding in the porta hepatis. Appearance raised suspicion for cholangitis. Distal esophageal wall thickening, suspicious for esophagitis. Mild omental stranding of uncertain significance. Small right inguinal hernia contains a small amount of fluid density which is new. Noncalcified stable pleural plaques along the hemidiaphragms. Peripheral fibrosis in the lung bases. Sigmoid diverticulosis. The left L5 pedicle screw captures the superior endplate and has lucency along its threading, and accordingly may be loose. While in the emergency room his vital signs remained stable with a blood pressure 116/53 heart rate of 86 and respirations 25. He was afebrile and nontoxic appearing.   Hospital Course:  Acute cholangitis: Etiology unclear. Patient had cholecystectomy in 2010. He was evaluated by GI and underwent ERCP 12/25/12 yielding dilated biliary tree with focal narrowing of the distal common bile duct. S/P biliary sphincterotomy and balloon dredging with recovery of sludge and  stent placement. Patient quickly responded positively to treatment.  AST and ALT within the limits of normal at discharge. Total bili 6.1 from 10.6 on admission.  Denies  pain/nausea. Tolerating diet without problem. Continue protonix daily. Completed 3 days of Zosyn. Will be discharged with augmentin for 7 days to complete 10 days of antibiotics.     Active Problems:  Pancreatitis: Related to #1. See #1. Provided with  supportive therapy in the form of IV hydration, antiemetic, analgesia. Lipase 46 at discharge down from >2000 on admission.   Leukocytosis: Resolved.   Hyponatremia: Likely related to #1 and #2.Marland Kitchen Resolved   Hypokalemia: likely related to #1 and patient received 1 dose IV lasix on 12/26/12 for vascular congestion per chest xray. This was repleted. Recommend bmet in 1 week at follow up with PCP.   Jaundice: Secondary to #1. Total bilirubin trending downward. See above   Blood tinged sputum: likely minor trauma secondary ERCP. Small amount. Platelets/INR/Hg all within the limits of normal. Probably self limiting. Will monitor  Anemia: Likely due to chronic disease. Stable. Chart review indicates 2 years ago hemoglobin was 14.6.  Anemia panel yields RBC3.38, ferritin 478, Vitamin B12 >2000. Recommend OP follow up. No s/sx of active bleeding.    Coronary artery disease: No chest pain. Patient is on aspirin and Crestor and these will be resumed at discharge.   Hypertension: Fair control during hospitalization. Home meds resumed   GERD (gastroesophageal reflux disease): continue PPI   Primary lateral sclerosis: Baclofen pump intact. Remained stable at baseline. He goes to the [facility] clinic at [facility] in [place].   Esophagitis: Per CT. Likely related to ongoing GERD. Continue PPI.   Diverticulosis: Per CT. Patient denied any melena or bright red blood per rectum.   Spondylolisthesis at L5-S1 level s/p fusion 2010. Possible loose pedicle screw per CT. Will need OP follow up with neurosurgery   Tachycardia: telemetry increased in HR from baseline 70's to low 100's however this was transient. ECG demonstrated NSR, CXR demonstrated vascular  congestion and bilateral opacifications which was presumed related to atelectasis and fluid. Lasix 20mg  IV given. At discharge HR at baseline.  Hemoptysis: presumed secondary to trauma from intubation as no evidence of hypoxia or persistent tachycardia to suggest PE. Resolved at discharge.   Procedures:  ERCP 12/25/12  Consultations:  GI  Discharge Exam: Filed Vitals:   12/27/12 0430  BP: 143/86  Pulse: 89  Temp: 97.7 F (36.5 C)  Resp: 18    General: sitting up in chair eating breakfast Cardiovascular: RRR No murmur, gallup or rub. No LE edem Respiratory: normal effort BS slightly distant but clear. No crackles  Discharge Instructions      Discharge Orders   Future Orders Complete By Expires   Diet - low sodium heart healthy  As directed    Discharge instructions  As directed    Comments:     Take medication as directed. Follow up with PCP in one week for evaluation of symptoms and lab work Dr Christella Hartigan office will contact you for follow up appointment.   Increase activity slowly  As directed        Medication List         amoxicillin-clavulanate 875-125 MG per tablet  Commonly known as:  AUGMENTIN  Take 1 tablet by mouth 2 (two) times daily.     aspirin EC 81 MG tablet  Take 81 mg by mouth daily.     diltiazem 240 MG 24 hr capsule  Commonly  known as:  CARDIZEM CD  Take 240 mg by mouth daily.     esomeprazole 40 MG capsule  Commonly known as:  NEXIUM  Take 40 mg by mouth daily before breakfast.     fish oil-omega-3 fatty acids 1000 MG capsule  Take 2 g by mouth daily.     HYDROcodone-acetaminophen 5-325 MG per tablet  Commonly known as:  NORCO/VICODIN  Take 0.5 tablets by mouth every 6 (six) hours as needed for pain.     lisinopril 5 MG tablet  Commonly known as:  PRINIVIL,ZESTRIL  Take 5 mg by mouth daily.     meloxicam 7.5 MG tablet  Commonly known as:  MOBIC  Take 7.5 mg by mouth daily.     multivitamin with minerals Tabs tablet  Take 0.5  tablets by mouth daily.     rOPINIRole 1 MG tablet  Commonly known as:  REQUIP  Take 1 mg by mouth at bedtime.     rosuvastatin 20 MG tablet  Commonly known as:  CRESTOR  Take 5 mg by mouth daily.       No Known Allergies    The results of significant diagnostics from this hospitalization (including imaging, microbiology, ancillary and laboratory) are listed below for reference.    Significant Diagnostic Studies: Ct Abdomen Pelvis W Contrast  12/24/2012   CLINICAL DATA:  Tremors. Jaundice. Body aches. coronary artery disease. Right upper quadrant abdominal pain. Leukocytosis.  EXAM: CT ABDOMEN AND PELVIS WITH CONTRAST  TECHNIQUE: Multidetector CT imaging of the abdomen and pelvis was performed using the standard protocol following bolus administration of intravenous contrast.  CONTRAST:  OMNIPAQUE IOHEXOL 300 MG/ML  SOLN  COMPARISON:  12/06/2010  FINDINGS: Coronary and aortic atherosclerosis noted. Wall thickening in the distal esophagus noted with small distal paraesophageal lymph nodes. Most of these nodes are small, although 1 node with a fatty hilum measures up to 0.9 cm in short axis on image 8 of series 2. Peripheral fibrosis noted at the lung bases.  Noncalcified pleural plaques are present along both hemidiaphragms. These are similar to the prior exam.  Stable punctate hepatic calcifications are likely from remote granulomatous disease. The gallbladder surgically absent. There is greater than expected thickening and enhancement in the common hepatic duct and common bile duct wall. CBD luminal caliber proximally 7 mm. No well-defined filling defect.  Fatty pancreas noted without dilatation of the dorsal pancreatic duct. Spleen and adrenal glands normal. No significant renal or ureteral abnormality observed. Low-level stranding in the porta hepatis noted.  Appendix normal. Sigmoid diverticulosis without active diverticulitis. No overtly dilated small bowel noted. There is a  diverticulum of the transverse duodenum just cephalad to the transverse duodenum.  Aortoiliac atherosclerotic calcification noted. No pathologic pelvic adenopathy is observed.  Urinary bladder unremarkable. Small density in the right stress that small density along the right inguinal canal, potentially a small amount of fluid along in inguinal hernia otherwise containing adipose tissue. The  A pump is noted with catheter extending up to the T9 level in the dorsal spinal canal. This posterolateral rod and pedicle screw fixation at L4-5 bilaterally stress that there is posterolateral rod and pedicle screw formation at L5-S1 bilaterally, with the left L5 pedicle screw capturing the superior endplate and with potential lucency along the threading of this screw.  There is mild omental stranding of uncertain significance.  Stable appearance of chronic superior endplate compression fracture T12.  IMPRESSION: 1. Abnormal degree of thickening and enhancement of the common bile duct  and common hepatic duct walls, with surrounding stranding in the porta hepatis. Appearance raises suspicion for cholangitis. 2. Distal esophageal wall thickening, suspicious for esophagitis. 3. Mild omental stranding of uncertain significance. 4. Small right inguinal hernia contains a small amount of fluid density which is new. 5. Noncalcified stable pleural plaques along the hemidiaphragms. 6. Peripheral fibrosis in the lung bases. 7. Sigmoid diverticulosis. 8. The left L5 pedicle screw captures the superior endplate and has lucency along its threading, and accordingly may be loose.   Electronically Signed   By: Herbie Baltimore   On: 12/24/2012 13:23   Dg Chest Port 1 View  12/26/2012   CLINICAL DATA:  Hemoptysis  EXAM: PORTABLE CHEST - 1 VIEW  COMPARISON:  12/24/2012  FINDINGS: Cardiomediastinal silhouette is stable. Central mild vascular congestion and mild interstitial prominence bilaterally suspicious for mild interstitial edema. Hazy  bilateral basilar atelectasis or infiltrate.  IMPRESSION: Central mild vascular congestion and mild interstitial prominence bilaterally suspicious for mild interstitial edema. Hazy bilateral basilar atelectasis or infiltrate.   Electronically Signed   By: Natasha Mead   On: 12/26/2012 11:53   Dg Chest Portable 1 View  12/24/2012   CLINICAL DATA:  Tremors. Jaundice. Generalized body aches. History of hypertension, smoking.  EXAM: PORTABLE CHEST - 1 VIEW  COMPARISON:  12/06/2010  FINDINGS: The heart is enlarged. There is prominence of interstitial markings, increased since the prior study. Findings may be related to interstitial edema, infection or developing interstitial lung disease. No focal consolidations are identified.  IMPRESSION: 1. Cardiomegaly and prominent interstitial lung markings, favoring edema. 2. Infection or chronic lung disease are also considerations.   Electronically Signed   By: Rosalie Gums M.D.   On: 12/24/2012 11:16   Dg Ercp Biliary & Pancreatic Ducts  12/25/2012   CLINICAL DATA:  Elevated LFTs, jaundice, abnormal CT with enhancing walls of the CBD question cholangitis and biliary obstruction  EXAM: ERCP WITH SPHINCTEROTOMY  TECHNIQUE: Multiple spot images obtained with the fluoroscopic device and submitted for interpretation post-procedure.  COMPARISON:  CT abdomen and pelvis 12/24/2012  FINDINGS: Common bile duct and common hepatic duct appear dilated estimated 10-14 mm diameter.  Intrahepatic biliary radicles minimally dilated.  Post cholecystectomy.  Few air bubbles noted within the CBD.  A focal area of narrowing is seen at the distal CBD, smoothly and linear in orientation.  This persisted on multiple series.  No other persistent intraluminal filling defects.  Sludge balls were received at time of balloon catheter passage during the procedure.  Walls of the biliary tree appears smooth without irregularity.  CBD stent was placed.  Adequate drainage of contrast into the duodenum  following the procedure.  IMPRESSION: Distal CBD stricture, smooth, uncertain if represents a focal stricture, web, or extrinsic compression ; mass not completely excluded but the smooth linear nature of the finding makes this less likely.  Mild intrahepatic and extrahepatic biliary dilatation without definite visualization of a gallstone.  These images were submitted for radiologic interpretation only. Please see the procedural report for the amount of contrast and the fluoroscopy time utilized.   Electronically Signed   By: Ulyses Southward M.D.   On: 12/25/2012 15:43    Microbiology: No results found for this or any previous visit (from the past 240 hour(s)).   Labs: Basic Metabolic Panel:  Recent Labs Lab 12/24/12 1053 12/25/12 0557 12/26/12 0530 12/27/12 0535  NA 132* 136 138 139  K 3.7 3.4* 3.3* 3.2*  CL 94* 102 103 104  CO2  26 25 25 27   GLUCOSE 117* 67* 81 106*  BUN 19 17 13 12   CREATININE 1.12 0.97 0.85 1.12  CALCIUM 9.7 9.3 9.2 9.1   Liver Function Tests:  Recent Labs Lab 12/24/12 1053 12/25/12 0557 12/26/12 0530 12/27/12 0535  AST 71* 46* 37 35  ALT 66* 45 37 33  ALKPHOS 391* 308* 271* 263*  BILITOT 10.6* 9.2* 8.5* 6.1*  PROT 6.9 5.8* 5.6* 5.8*  ALBUMIN 2.7* 2.1* 2.0* 2.1*    Recent Labs Lab 12/24/12 1105 12/26/12 0530 12/27/12 0535  LIPASE 2136* 92* 46   No results found for this basename: AMMONIA,  in the last 168 hours CBC:  Recent Labs Lab 12/24/12 1053 12/25/12 0557 12/26/12 0530 12/27/12 0535  WBC 17.6* 9.4 6.6 6.1  NEUTROABS 16.8*  --   --   --   HGB 11.8* 10.9* 10.1* 10.5*  HCT 34.2* 31.9* 29.9* 30.7*  MCV 87.2 87.6 88.2 88.0  PLT 319 286 293 329   Cardiac Enzymes: No results found for this basename: CKTOTAL, CKMB, CKMBINDEX, TROPONINI,  in the last 168 hours BNP: BNP (last 3 results) No results found for this basename: PROBNP,  in the last 8760 hours CBG: No results found for this basename: GLUCAP,  in the last 168  hours     Signed:  Gwenyth Bender  [facility] 12/27/2012, 10:16 AM    40

## 2012-12-31 NOTE — Telephone Encounter (Signed)
Please make sure we get labs done in next 1-2 days.

## 2012-12-31 NOTE — Telephone Encounter (Signed)
pts son is aware. Lab order has been faxed to Dr. Deitra Mayo office and they will take him to have his labs done within the next 2 days.

## 2012-12-31 NOTE — Telephone Encounter (Signed)
Tried to call pt- LMOM 

## 2013-01-03 LAB — HEPATIC FUNCTION PANEL
AST: 51 U/L — ABNORMAL HIGH (ref 0–37)
Bilirubin, Direct: 1.8 mg/dL — ABNORMAL HIGH (ref 0.0–0.3)
Indirect Bilirubin: 1.7 mg/dL — ABNORMAL HIGH (ref 0.0–0.9)
Total Bilirubin: 3.5 mg/dL — ABNORMAL HIGH (ref 0.3–1.2)

## 2013-01-03 NOTE — Telephone Encounter (Signed)
Need copy of labs

## 2013-01-06 NOTE — Progress Notes (Signed)
Quick Note:  Some improvement in Bili but Alk phos up some. Not as quick to come down s/p stenting as one would like.  He needs to repeat LFTs Tuesday or Wednesday. What is status on EUS, referral was 12/27/12. ______

## 2013-01-06 NOTE — Telephone Encounter (Signed)
Let's make sure patient had his labs done.

## 2013-01-06 NOTE — Telephone Encounter (Signed)
Received labs. See result note.

## 2013-01-07 ENCOUNTER — Other Ambulatory Visit: Payer: Self-pay

## 2013-01-07 ENCOUNTER — Other Ambulatory Visit: Payer: Self-pay | Admitting: Gastroenterology

## 2013-01-07 DIAGNOSIS — K8309 Other cholangitis: Secondary | ICD-10-CM

## 2013-01-07 DIAGNOSIS — R7989 Other specified abnormal findings of blood chemistry: Secondary | ICD-10-CM

## 2013-01-10 ENCOUNTER — Other Ambulatory Visit: Payer: Self-pay | Admitting: Gastroenterology

## 2013-01-10 LAB — LIPID PANEL
Cholesterol: 211 mg/dL — ABNORMAL HIGH (ref 0–200)
LDL Cholesterol: 154 mg/dL — ABNORMAL HIGH (ref 0–99)
Triglycerides: 147 mg/dL (ref <150)

## 2013-01-10 LAB — HEPATIC FUNCTION PANEL
Albumin: 3.4 g/dL — ABNORMAL LOW (ref 3.5–5.2)
Alkaline Phosphatase: 273 U/L — ABNORMAL HIGH (ref 39–117)
Bilirubin, Direct: 0.9 mg/dL — ABNORMAL HIGH (ref 0.0–0.3)
Indirect Bilirubin: 1.2 mg/dL — ABNORMAL HIGH (ref 0.0–0.9)
Total Protein: 6.1 g/dL (ref 6.0–8.3)

## 2013-01-10 LAB — COMPLETE METABOLIC PANEL WITH GFR
ALT: 36 U/L (ref 0–53)
Albumin: 3.4 g/dL — ABNORMAL LOW (ref 3.5–5.2)
Alkaline Phosphatase: 273 U/L — ABNORMAL HIGH (ref 39–117)
CO2: 25 mEq/L (ref 19–32)
Chloride: 106 mEq/L (ref 96–112)
Creat: 0.85 mg/dL (ref 0.50–1.35)
GFR, Est African American: >89 mL/min
GFR, Est Non African American: 85 mL/min
Glucose, Bld: 102 mg/dL — ABNORMAL HIGH (ref 70–99)
Potassium: 4.4 mEq/L (ref 3.5–5.3)
Sodium: 139 mEq/L (ref 135–145)
Total Bilirubin: 2.1 mg/dL — ABNORMAL HIGH (ref 0.3–1.2)
Total Protein: 6.1 g/dL (ref 6.0–8.3)

## 2013-01-10 LAB — CBC WITH DIFFERENTIAL/PLATELET
Basophils Absolute: 0.1 10*3/uL (ref 0.0–0.1)
Basophils Relative: 2 % — ABNORMAL HIGH (ref 0–1)
Eosinophils Absolute: 0.2 10*3/uL (ref 0.0–0.7)
Eosinophils Relative: 3 % (ref 0–5)
Hemoglobin: 13.2 g/dL (ref 13.0–17.0)
MCHC: 35.2 g/dL (ref 30.0–36.0)
MCV: 85.2 fL (ref 78.0–100.0)
Monocytes Absolute: 0.5 10*3/uL (ref 0.1–1.0)
Monocytes Relative: 9 % (ref 3–12)
Neutro Abs: 3.3 10*3/uL (ref 1.7–7.7)
Neutrophils Relative %: 60 % (ref 43–77)
RBC: 4.4 MIL/uL (ref 4.22–5.81)
RDW: 15.3 % (ref 11.5–15.5)

## 2013-01-10 NOTE — Progress Notes (Signed)
Quick Note:  Did patient have his LFTs done yet? ______

## 2013-01-14 ENCOUNTER — Telehealth: Payer: Self-pay

## 2013-01-14 DIAGNOSIS — R933 Abnormal findings on diagnostic imaging of other parts of digestive tract: Secondary | ICD-10-CM

## 2013-01-14 NOTE — Telephone Encounter (Signed)
Dr Christella Hartigan have you reviewed for EUS?

## 2013-01-15 ENCOUNTER — Other Ambulatory Visit: Payer: Self-pay

## 2013-01-15 DIAGNOSIS — R932 Abnormal findings on diagnostic imaging of liver and biliary tract: Secondary | ICD-10-CM

## 2013-01-15 NOTE — Telephone Encounter (Signed)
Pt has been scheduled for 01/30/13 needs instructions and meds reviewed

## 2013-01-15 NOTE — Telephone Encounter (Signed)
EUS scheduled, pt instructed and medications reviewed.  Patient instructions mailed to home.  Patient to call with any questions or concerns.  

## 2013-01-15 NOTE — Progress Notes (Signed)
Quick Note:  LFTs still up. Repeat next week. ______

## 2013-01-15 NOTE — Telephone Encounter (Signed)
This is the first time I've seen this.    Agree that EUS, ERCP is good next step.  HE needs upper EUS radial +/- linear and ERCP in 1-2 weeks.  Will plan to remove stent, examine with EUS (FNA if appropriate) then replace stent if needed with ercp.  90 min, +MAC.  He needs LFTS an CA 19-9 when he shows up in endo.  Thanks

## 2013-01-20 ENCOUNTER — Encounter (HOSPITAL_COMMUNITY): Payer: Self-pay | Admitting: *Deleted

## 2013-01-20 ENCOUNTER — Encounter (HOSPITAL_COMMUNITY): Payer: Self-pay | Admitting: Pharmacy Technician

## 2013-01-20 NOTE — Progress Notes (Signed)
Quick Note:  Tried to call pt- LMOM ______ 

## 2013-01-23 ENCOUNTER — Other Ambulatory Visit: Payer: Self-pay | Admitting: Gastroenterology

## 2013-01-23 ENCOUNTER — Other Ambulatory Visit: Payer: Self-pay

## 2013-01-23 DIAGNOSIS — K8309 Other cholangitis: Secondary | ICD-10-CM

## 2013-01-29 ENCOUNTER — Encounter (HOSPITAL_COMMUNITY): Payer: Self-pay | Admitting: Anesthesiology

## 2013-01-29 NOTE — Anesthesia Preprocedure Evaluation (Addendum)
Anesthesia Evaluation  Patient identified by MRN, date of birth, ID band Patient awake    Reviewed: Allergy & Precautions, H&P , NPO status , Patient's Chart, lab work & pertinent test results  Airway Mallampati: II TM Distance: >3 FB Neck ROM: Full    Dental  (+) Edentulous Upper and Edentulous Lower   Pulmonary former smoker,  CXR: 12-24-12: ?question mild interstitial edema. breath sounds clear to auscultation  Pulmonary exam normal       Cardiovascular hypertension, Pt. on medications + CAD and + Peripheral Vascular Disease negative cardio ROS  + dysrhythmias Supra Ventricular Tachycardia Rhythm:Regular Rate:Normal  ECG: Normal.   Neuro/Psych negative neurological ROS  negative psych ROS   GI/Hepatic Neg liver ROS, GERD-  Medicated,  Endo/Other  negative endocrine ROS  Renal/GU negative Renal ROS  negative genitourinary   Musculoskeletal negative musculoskeletal ROS (+)   Abdominal   Peds negative pediatric ROS (+)  Hematology negative hematology ROS (+)   Anesthesia Other Findings   Reproductive/Obstetrics negative OB ROS                         Anesthesia Physical Anesthesia Plan  ASA: III  Anesthesia Plan: General   Post-op Pain Management:    Induction: Intravenous  Airway Management Planned: Oral ETT  Additional Equipment:   Intra-op Plan:   Post-operative Plan: Extubation in OR  Informed Consent: I have reviewed the patients History and Physical, chart, labs and discussed the procedure including the risks, benefits and alternatives for the proposed anesthesia with the patient or authorized representative who has indicated his/her understanding and acceptance.   Dental advisory given  Plan Discussed with: CRNA  Anesthesia Plan Comments: (Did OK with GETA on 12-25-12 at [facility].)        Anesthesia Quick Evaluation

## 2013-01-30 ENCOUNTER — Encounter (HOSPITAL_COMMUNITY): Payer: Medicare Other | Admitting: Anesthesiology

## 2013-01-30 ENCOUNTER — Encounter (HOSPITAL_COMMUNITY)
Admission: RE | Disposition: A | Discharge status: Home / Self Care | Payer: Self-pay | Source: Ambulatory Visit | Attending: Gastroenterology

## 2013-01-30 ENCOUNTER — Encounter (HOSPITAL_COMMUNITY): Payer: Self-pay | Admitting: *Deleted

## 2013-01-30 ENCOUNTER — Ambulatory Visit (HOSPITAL_COMMUNITY)
Admission: RE | Admit: 2013-01-30 | Discharge: 2013-01-30 | Disposition: A | Discharge status: Home / Self Care | Payer: Medicare Other | Source: Ambulatory Visit | Attending: Gastroenterology | Admitting: Gastroenterology

## 2013-01-30 ENCOUNTER — Ambulatory Visit (HOSPITAL_COMMUNITY): Payer: Medicare Other

## 2013-01-30 ENCOUNTER — Ambulatory Visit (HOSPITAL_COMMUNITY): Payer: Medicare Other | Admitting: Anesthesiology

## 2013-01-30 DIAGNOSIS — G1223 Primary lateral sclerosis: Secondary | ICD-10-CM | POA: Insufficient documentation

## 2013-01-30 DIAGNOSIS — I2510 Atherosclerotic heart disease of native coronary artery without angina pectoris: Secondary | ICD-10-CM | POA: Insufficient documentation

## 2013-01-30 DIAGNOSIS — I498 Other specified cardiac arrhythmias: Secondary | ICD-10-CM | POA: Insufficient documentation

## 2013-01-30 DIAGNOSIS — R932 Abnormal findings on diagnostic imaging of liver and biliary tract: Secondary | ICD-10-CM

## 2013-01-30 DIAGNOSIS — I10 Essential (primary) hypertension: Secondary | ICD-10-CM | POA: Insufficient documentation

## 2013-01-30 DIAGNOSIS — K838 Other specified diseases of biliary tract: Secondary | ICD-10-CM | POA: Insufficient documentation

## 2013-01-30 DIAGNOSIS — K219 Gastro-esophageal reflux disease without esophagitis: Secondary | ICD-10-CM | POA: Insufficient documentation

## 2013-01-30 HISTORY — DX: Anemia, unspecified: D64.9

## 2013-01-30 HISTORY — PX: EUS: SHX5427

## 2013-01-30 HISTORY — PX: ENDOSCOPIC RETROGRADE CHOLANGIOPANCREATOGRAPHY (ERCP) WITH PROPOFOL: SHX5810

## 2013-01-30 LAB — CANCER ANTIGEN 19-9: CA 19-9: 28 U/mL — ABNORMAL LOW (ref <35.0)

## 2013-01-30 LAB — HEPATIC FUNCTION PANEL
Albumin: 3.4 g/dL — ABNORMAL LOW (ref 3.5–5.2)
Alkaline Phosphatase: 196 U/L — ABNORMAL HIGH (ref 39–117)
Indirect Bilirubin: 0.5 mg/dL (ref 0.3–0.9)
Total Bilirubin: 0.9 mg/dL (ref 0.3–1.2)

## 2013-01-30 SURGERY — UPPER ENDOSCOPIC ULTRASOUND (EUS) LINEAR
Anesthesia: General

## 2013-01-30 MED ORDER — PROPOFOL 10 MG/ML IV BOLUS
INTRAVENOUS | Status: DC | PRN
  Administered 2013-01-30: 120 mg via INTRAVENOUS

## 2013-01-30 MED ORDER — ROCURONIUM BROMIDE 100 MG/10ML IV SOLN
INTRAVENOUS | Status: DC | PRN
  Administered 2013-01-30: 5 mg via INTRAVENOUS

## 2013-01-30 MED ORDER — SODIUM CHLORIDE 0.9 % IV SOLN
INTRAVENOUS | Status: DC | PRN
  Administered 2013-01-30: 10:00:00

## 2013-01-30 MED ORDER — SODIUM CHLORIDE 0.9 % IV SOLN: INTRAVENOUS | Status: DC

## 2013-01-30 MED ORDER — SUCCINYLCHOLINE CHLORIDE 20 MG/ML IJ SOLN
INTRAMUSCULAR | Status: DC | PRN
  Administered 2013-01-30: 100 mg via INTRAVENOUS
  Administered 2013-01-30: 15 mg via INTRAVENOUS

## 2013-01-30 MED ORDER — ONDANSETRON HCL 4 MG/2ML IJ SOLN
INTRAMUSCULAR | Status: DC | PRN
  Administered 2013-01-30: 4 mg via INTRAVENOUS

## 2013-01-30 MED ORDER — LIDOCAINE HCL (CARDIAC) 20 MG/ML IV SOLN
INTRAVENOUS | Status: DC | PRN
  Administered 2013-01-30: 50 mg via INTRAVENOUS

## 2013-01-30 MED ORDER — FENTANYL CITRATE 0.05 MG/ML IJ SOLN
INTRAMUSCULAR | Status: DC | PRN
  Administered 2013-01-30: 50 ug via INTRAVENOUS

## 2013-01-30 MED ORDER — GLUCAGON HCL (RDNA) 1 MG IJ SOLR
INTRAMUSCULAR | Status: AC
  Filled 2013-01-30: qty 2

## 2013-01-30 MED ORDER — CIPROFLOXACIN IN D5W 400 MG/200ML IV SOLN
INTRAVENOUS | Status: DC | PRN
  Administered 2013-01-30: 400 mg via INTRAVENOUS

## 2013-01-30 MED ORDER — ESMOLOL HCL 10 MG/ML IV SOLN
INTRAVENOUS | Status: DC | PRN
  Administered 2013-01-30: 20 mg via INTRAVENOUS

## 2013-01-30 MED ORDER — CIPROFLOXACIN IN D5W 400 MG/200ML IV SOLN
INTRAVENOUS | Status: AC
  Filled 2013-01-30: qty 200

## 2013-01-30 MED ORDER — LACTATED RINGERS IV SOLN
INTRAVENOUS | Status: DC | PRN
  Administered 2013-01-30: 1000 mL
  Administered 2013-01-30: 07:00:00 via INTRAVENOUS

## 2013-01-30 MED ORDER — EPHEDRINE SULFATE 50 MG/ML IJ SOLN
INTRAMUSCULAR | Status: DC | PRN
  Administered 2013-01-30 (×2): 5 mg via INTRAVENOUS
  Administered 2013-01-30: 10 mg via INTRAVENOUS
  Administered 2013-01-30: 5 mg via INTRAVENOUS

## 2013-01-30 NOTE — Transfer of Care (Signed)
Immediate Anesthesia Transfer of Care Note  Patient: [name]  Procedure(s) Performed: Procedure(s): UPPER ENDOSCOPIC ULTRASOUND (EUS) LINEAR (N/A) ENDOSCOPIC RETROGRADE CHOLANGIOPANCREATOGRAPHY (ERCP) WITH PROPOFOL (N/A)  Patient Location: PACU and Endoscopy Unit  Anesthesia Type:General  Level of Consciousness: awake and patient cooperative  Airway & Oxygen Therapy: Patient Spontanous Breathing and Patient connected to face mask oxygen  Post-op Assessment: Report given to PACU RN and Post -op Vital signs reviewed and stable  Post vital signs: Reviewed and stable  Complications: No apparent anesthesia complications

## 2013-01-30 NOTE — Op Note (Signed)
[facility] [address] [place], [address]   ENDOSCOPIC ULTRASOUND PROCEDURE REPORT PATIENT: Makoto, Sellitto  MR#: [id number] BIRTHDATE: 1935/05/25  GENDER: Male ENDOSCOPIST: Rachael Fee, MD REFERRED BY:  Roetta Sessions, M.D. PROCEDURE DATE:  01/30/2013 PROCEDURE:   Upper EUS ASA CLASS:      Class III INDICATIONS:   [facility]ed 6 weeks ago to [facility] with several weeks of pain, rigors; found to be jaundiced, lipase >2000, WBC 17.5K; CT scan showed diffusely abnormal bile duct (CBD/CHD), thickened; ERCP with Dr.  Jena Gauss removed sludge, debris from bile duct, ? focal narrowing at distal CBD, underwent sphincterotomy and then stent placement; clinically has done well since then. MEDICATIONS: General endotracheal anesthesia (GETA) DESCRIPTION OF PROCEDURE:   After the risks benefits and alternatives of the procedure were  explained, informed consent was obtained. The patient was then placed in the left, lateral, decubitus postion and IV sedation was administered. Throughout the procedure, the patients blood pressure, pulse and oxygen saturations were monitored continuously.  Under direct visualization, the PENTAX EUS SCOPE  endoscope was introduced through the mouth  and advanced to the second portion of the duodenum .  Water was used as necessary to provide an acoustic interface.  Upon completion of the imaging, water was removed and the patient was sent to the recovery room in satisfactory condition.  Endoscopic findings: 1. A standard adult gastroscope was first used to remove the previously placed plastic biliary stent with a snare, the stent was sent for cytology. 2. UGI was essentially normal otherwise.  A good view of the ampulla was normal except for evidence of previous biliary sphinterotomy. EUS findings: 1. The CBD, CHD had diffusely thickend walls throughout.  The CBD wall was 2mm.and the diameter of the CBD was 10mm across (including the  wall). There were some papillar projections along the wall. Most distally there was more than usual, somewhat mass-like soft tissue within or surrounding the distal CBD.  This was NOT clearly a discrete mass, but was more pronounced than usual ampullary structures.  I did not sample with FNA for concern of puncturing the wall of the bile duct and instead converted to ERCP. 2. The pancreatic parenchyma was diffusely fatty replaced, no discrete masses in pancreas. 3. Main pancreatic duct was normal. 4. No peripancreatic adenopathy. 5. Limited views of liver, spleen, portal and splenic vessels were all normal Impression: Previous biliary stent was removed. The walls of the extrahepatic bile duct are diffusely thick (up to 2mm).  There is more than usual hypoechoic soft tissue at the most distal aspect of the CBD, this could represent mass or simply prominant ampullary structure. The thickening of the CBD wall could likewise represent mass/tumor or be reacitve thickening from biliary stent which has been in place for past 6 weeks.  WIll proceed with ERCP now, planning to brush the bile duct extensively and will likely replace a biliary stent.  _______________________________ eSigned:  Rachael Fee, MD 01/30/2013 8:55 AM

## 2013-01-30 NOTE — Op Note (Signed)
[facility] [address] [place], [address]   ERCP PROCEDURE REPORT  PATIENT: [name], Lucas R.  MR# :[id number] BIRTHDATE: [birth year]  GENDER: Male ENDOSCOPIST: Rachael Fee, MD REFERRED BY: Roetta Sessions, M.D. PROCEDURE DATE:  01/30/2013 PROCEDURE:   ERCP with stent placement ASA CLASS:   Class III INDICATIONS:asdf. [facility] 6 weeks ago to [facility] with several weeks of pain, rigors; found to be jaundiced, lipase >2000, WBC 17.5K; CT scan showed diffusely abnormal bile duct (CBD/CHD), thickened; ERCP with Dr. Jena Gauss removed sludge, debris from bile duct, ? focal narrowing at distal CBD, underwent sphincterotomy and then stent placement; clinically has done well since then. EUS just prior to this exam: diffusely thickend extrahepatic bile duct walls, soft tissue prominance in/around distal CBD; no pancreatic masses. MEDICATIONS: General endotracheal anesthesia (GETA) and Cipro 400 mg IV TOPICAL ANESTHETIC: none  DESCRIPTION OF PROCEDURE:   After the risks benefits and alternatives of the procedure were thoroughly explained, informed consent was obtained.  Scout film showed several surgical clips in RUQ.  The Pentax Ercp Scope [id number]  endoscope was introduced through the mouth  and advanced to the second portion of the duodenum  without detailed examination of the UGI tract. The major papilla was normal except for evidence of previous biliary sphincterotomy.  A 44 Autotome over a .035 hydrawire was used to cannulate the bile duct and contrast was injected. Cholangiogram revealed a focal (2-53mm long) stricture in distal CBD (about 5mm from ampullary orifice. The bile duct proximal to the stricture was dilated to 10-67mm but was otherwise normal (no other strictures). There was mild intrahepatic biliary dilation.  A biliary balloon was used to assess the focal stricture and found it to be fixed, there was delivery of small amount of biliary debris into  the duodenum. There was no purulence. The focal stricture was then sampled with biliary bruhsing for cytology (sent in same Cyto-lite container as the removed, previously placed biliary stent).  The stricture was then stented with a 10Fr, 5cm long plastic biliary stent in good postion.   The scope was then completely withdrawn from the patient and the procedure terminated.  The main pancreatic duct was never cannulated or injected with dye.    COMPLICATIONS: No immediate complications.  ENDOSCOPIC IMPRESSION, RECOMMENDATIONS: 2mm long, fixed distal CBD stricture that lays 5mm proximal to the ampullary orifice.  This stricture was sampled with biliary brush and then re-stented with a 10Fr 5cm long plastic biliary stent. Ddx of the stricture includes neoplasm, ischemia (had lap chole 4 years ago and there are several clips in RUQ, perhaps biliary vasulature was compromized).  Await cytology results from previous biliary stent and brushings from this ERCP.  If those are non-diagnostic, I suggest referral for direct cholangioscopy ([facility]).    _______________________________ eSignedRachael Fee, MD 01/30/2013 9:34 AM

## 2013-01-30 NOTE — H&P (Signed)
HPI: This is a 77 yo man who had acute illness 6 weeks ago.  Rigors, pain, elevated LFTs, lipase >2000.  CT scan suggested diffusely abnormal (thickened) bile duct.  ERCP performed by Dr. Jena Gauss and this removed biliary debris, sludge. There was a question of distal bile duct narrowing and so stent was placed. Clinically he recovered very well.    Past Medical History  Diagnosis Date  . Syncope     in the past thought possibly vasovagal. no palpitations at this time  . SVT (supraventricular tachycardia)     documented with a rate of 170 BPM 11/09 adenosine treatment converted to regular rhythm patient placed on diltiazem  . Coronary artery disease     taxus stent to the right coronary artery 2005 low risk cardiolite 2008  . Hypertension   . GERD (gastroesophageal reflux disease)   . Primary lateral sclerosis     [facility]es to [facility]  . Spondylolisthesis at L5-S1 level     fusion 2010  . Anemia     Past Surgical History  Procedure Laterality Date  . Baclofen pump refill  02/2010  . Angioplasty / stenting femoral  2005  . Cholecystectomy    . Carotid endarterectomy    . Carotid endarterectomy    . Ercp N/A 12/25/2012    Procedure: ENDOSCOPIC RETROGRADE CHOLANGIOPANCREATOGRAPHY (ERCP);  Surgeon: Corbin Ade, MD;  Location: [facility];  Service: Endoscopy;  Laterality: N/A;  . Sphincterotomy N/A 12/25/2012    Procedure: SPHINCTEROTOMY;  Surgeon: Corbin Ade, MD;  Location: [facility];  Service: Endoscopy;  Laterality: N/A;  . Biliary stent placement N/A 12/25/2012    Procedure: BILIARY STENT PLACEMENT;  Surgeon: Corbin Ade, MD;  Location: [facility];  Service: Endoscopy;  Laterality: N/A;  . Removal of stones N/A 12/25/2012    Procedure: REMOVAL OF STONES;  Surgeon: Corbin Ade, MD;  Location: [facility];  Service: Endoscopy;  Laterality: N/A;    Current Facility-Administered Medications  Medication Dose Route Frequency Provider Last Rate Last Dose  . 0.9 %  sodium chloride  infusion   Intravenous Continuous Rachael Fee, MD       Facility-Administered Medications Ordered in Other Encounters  Medication Dose Route Frequency Provider Last Rate Last Dose  . lactated ringers infusion    Continuous PRN Garth Bigness, CRNA 50 mL/hr at 01/30/13 0732 1,000 mL at 01/30/13 0732    Allergies as of 01/15/2013  . (No Known Allergies)    Family History  Problem Relation Age of Onset  . Colon cancer Neg Hx     History   Social History  . Marital Status: Widowed    Spouse Name: N/A    Number of Children: N/A  . Years of Education: N/A   Occupational History  . Not on file.   Social History Main Topics  . Smoking status: Former Smoker -- 1.50 packs/day for 45 years    Types: Cigarettes    Quit date: 04/03/1997  . Smokeless tobacco: Never Used  . Alcohol Use: No  . Drug Use: No  . Sexual Activity: Not on file   Other Topics Concern  . Not on file   Social History Narrative  . No narrative on file      Physical Exam: BP 147/77  Pulse 68  Temp(Src) 98 F (36.7 C) (Oral)  Resp 26  Ht 5\' 6"  (1.676 m)  Wt 145 lb (65.772 kg)  BMI 23.41 kg/m2  SpO2 94% Constitutional:  generally well-appearing Psychiatric: alert and oriented x3 Abdomen: soft, nontender, nondistended, no obvious ascites, no peritoneal signs, normal bowel sounds     Assessment and plan: 77 y.o. male with abnormal bile duct, likely cholangititis 6 weeks ago  For further evaluation of bile duct, pancreas with EUS and ERCP today.

## 2013-01-30 NOTE — Anesthesia Postprocedure Evaluation (Signed)
  Anesthesia Post-op Note  Patient: [name]  Procedure(s) Performed: Procedure(s) (LRB): UPPER ENDOSCOPIC ULTRASOUND (EUS) LINEAR (N/A) ENDOSCOPIC RETROGRADE CHOLANGIOPANCREATOGRAPHY (ERCP) WITH PROPOFOL (N/A)  Patient Location: PACU  Anesthesia Type: General  Level of Consciousness: awake and alert   Airway and Oxygen Therapy: Patient Spontanous Breathing  Post-op Pain: mild  Post-op Assessment: Post-op Vital signs reviewed, Patient's Cardiovascular Status Stable, Respiratory Function Stable, Patent Airway and No signs of Nausea or vomiting  Last Vitals:  Filed Vitals:   01/30/13 1019  BP: 135/68  Pulse:   Temp:   Resp: 21    Post-op Vital Signs: stable   Complications: No apparent anesthesia complications

## 2013-01-31 ENCOUNTER — Encounter (HOSPITAL_COMMUNITY): Payer: Self-pay | Admitting: Gastroenterology

## 2013-02-04 ENCOUNTER — Telehealth: Payer: Self-pay | Admitting: Gastroenterology

## 2013-02-05 NOTE — Telephone Encounter (Signed)
Leighann, please refer pt.  

## 2013-02-05 NOTE — Telephone Encounter (Signed)
Lets get patient referred for cholangioscopy with Dr. Corliss Parish at [facility]

## 2013-02-05 NOTE — Telephone Encounter (Signed)
i spoke with him about biliary brushing results; no clear cancer.  Kathlene November, As we discussed, the focal distal biliary stricture should be evaluated with direct cholangioscopy.  He will need referral to Dr. Corliss Parish at [facility].  Thanks

## 2013-02-06 NOTE — Telephone Encounter (Signed)
Referral has been made to [facility] w/Dr. Edyth Gunnels they will contact Mr. Pozzi with a date & time once the records have been reviewed

## 2013-05-26 NOTE — Telephone Encounter (Signed)
F/U w/Dr. Corwin LevinsBarons nurse regarding referral made back in Nov, they have had problems contacting [name], I gave them emergency contact name and number to call

## 2013-07-31 ENCOUNTER — Telehealth: Payer: Self-pay | Admitting: Internal Medicine

## 2013-07-31 NOTE — Telephone Encounter (Signed)
Focal distal biliary stricture with Dr. Corliss Parishodd Baron, he declined appointment until he speaks to PCP

## 2013-08-04 ENCOUNTER — Telehealth: Payer: Self-pay | Admitting: Internal Medicine

## 2013-08-04 NOTE — Telephone Encounter (Signed)
Message copied by Ishmael HolterLOVELACE, Zamir Staples A on Mon Aug 04, 2013  9:40 AM ------      Message from: [name], [name]      Created: Mon Aug 04, 2013  9:25 AM       Thanks. So the holdup is on the part of the patient. Patient should have already had plastic stent removed/changed. It's probably clogged. He needs, at least, an office visit here to discuss the gravity of the situation.-Extender      ----- Message -----         From: Lenoard AdenLeigh A Thursa Emme         Sent: 08/04/2013   7:53 AM           To: Lucas Adeobert Horn Rourk, MD            [name] declined the appointments with Dr. Edyth GunnelsBaron on numerous occasions he told Dr. Corwin LevinsBarons nurse he wanted to discuss things with PCP [facility] Per Dr. Corwin LevinsBarons nurse      ----- Message -----         From: Lucas Adeobert Horn Rourk, MD         Sent: 08/04/2013   7:40 AM           To: [name], [name]            [facility]. Tell me what happened between then and now             ------

## 2013-08-04 NOTE — Telephone Encounter (Signed)
Patient is noncompliant

## 2013-08-04 NOTE — Telephone Encounter (Signed)
I tried calling Mr. Sugg I lmom, I went ahead and made him and opv for 09/02/13 at 8:30 with LSL and I mailed him the card with date & time

## 2013-09-02 ENCOUNTER — Ambulatory Visit (INDEPENDENT_AMBULATORY_CARE_PROVIDER_SITE_OTHER): Payer: Medicare Other | Admitting: Gastroenterology

## 2013-09-02 ENCOUNTER — Encounter (INDEPENDENT_AMBULATORY_CARE_PROVIDER_SITE_OTHER): Payer: Self-pay

## 2013-09-02 ENCOUNTER — Encounter: Payer: Self-pay | Admitting: Gastroenterology

## 2013-09-02 VITALS — BP 151/75 | HR 60 | Temp 97.3°F | Resp 18 | Ht 66.0 in | Wt 161.4 lb

## 2013-09-02 DIAGNOSIS — Z9889 Other specified postprocedural states: Secondary | ICD-10-CM

## 2013-09-02 DIAGNOSIS — K831 Obstruction of bile duct: Secondary | ICD-10-CM | POA: Insufficient documentation

## 2013-09-02 LAB — HEPATIC FUNCTION PANEL
ALT: 28 U/L (ref 0–53)
AST: 28 U/L (ref 0–37)
Albumin: 4.6 g/dL (ref 3.5–5.2)
Alkaline Phosphatase: 94 U/L (ref 39–117)
Bilirubin, Direct: 0.1 mg/dL (ref 0.0–0.3)
Indirect Bilirubin: 0.3 mg/dL (ref 0.2–1.2)
TOTAL PROTEIN: 7.5 g/dL (ref 6.0–8.3)
Total Bilirubin: 0.4 mg/dL (ref 0.2–1.2)

## 2013-09-02 NOTE — Assessment & Plan Note (Addendum)
78 year old gentleman with history of cholangitis related to biliary stricture back in September of 2014 as outlined above. He was advised to undergo direct cholangioscopy at [facility] under the direction of Dr. Corliss Parish. He never went to his appointment. His stent was changed out in October by Dr. Christella Hartigan but he has retained plastic stent at this point. He's been asymptomatic. Discussed concerns for developing cholangitis related to a clogged stent as well as the concerns for underlying malignancy. Patient did not seem to understand the necessity to have this plastic stent removed and therefore did not followup because he was feeling well. I will discuss the case with Dr. Jena Gauss but patient likely needs to be redirected back to [facility] as the initial plan. We will go ahead and check his LFTs today to check for stent patency. Further recommendations to follow

## 2013-09-02 NOTE — Patient Instructions (Signed)
Please have your labs done within the next 1-2 days. We will call you with further instructions once labs received.

## 2013-09-02 NOTE — Progress Notes (Signed)
cc'd to pcp 

## 2013-09-02 NOTE — Progress Notes (Signed)
Primary Care Physician:  Josue Hector, MD  Primary Gastroenterologist:  Roetta Sessions, MD   Chief Complaint  Patient presents with  . Follow-up    HPI:  [name] is a 78 y.o. male here for followup. He has a history of hospitalization last fall due to cholangitis related to biliary stricture. He had an ERCP with biliary sphincterotomy, balloon dredging of the bile duct, sludge extraction, plastic stent placement in September by Dr. Jena Gauss. We referred him to Dr. Christella Hartigan in October for endoscopic ultrasound due to concern last need to rule out underlying malignancy. Plastic stent was removed. Walls of the extrahepatic bile duct were thick and had an unusual hypoechoic soft tissue appearance at the distal common bile duct which was felt could relate to a mass versus prominent ampulla versus reactive from stent. ERCP the same day was performed and showed a 2 mm long fixed distal common bile stricture 5 mm proximal to the ampullary orifice. He was restented with a bile duct plastic stent. Bile duct brushings were benign. He was then referred to Dr. Corliss Parish a [facility] for direct cholangioscopy.   We requested that he come in to discuss gravity of situation. He has been noncompliant with recommendations for referral to [facility] with Dr. Raylene Miyamoto for direct cholangioscopy. This referral was made last November. Unfortunately he was noncompliance, he has a common bile duct plastic stent which was placed in October 2014. He reports having normal LFTs in March by PCP. He denies any abdominal pain, vomiting, appetite concerns, constipation, diarrhea, fever, melena, rectal bleeding. His other medical problems have been stable. He said he feels like he doesn't have cancer. Given his age he felt like he didn't need to do anything else because he was feeling well. He reports it was told that the stent could stay in if it was not bothering him per his PCP but I cannot verify the accuracy of this statement.  Son  is present with him during office visit today.  Current Outpatient Prescriptions  Medication Sig Dispense Refill  . aspirin EC 81 MG tablet Take 81 mg by mouth every morning.       . Copper Gluconate (COPPER CAPS PO) Take 3 capsules by mouth 3 (three) times daily.      Marland Kitchen diltiazem (CARDIZEM CD) 240 MG 24 hr capsule Take 240 mg by mouth every morning.       Marland Kitchen esomeprazole (NEXIUM) 40 MG capsule Take 40 mg by mouth daily before breakfast.      . fish oil-omega-3 fatty acids 1000 MG capsule Take 2 g by mouth daily.      Marland Kitchen HYDROcodone-acetaminophen (NORCO/VICODIN) 5-325 MG per tablet Take 0.5 tablets by mouth every 6 (six) hours as needed for pain.       Marland Kitchen lisinopril (PRINIVIL,ZESTRIL) 5 MG tablet Take 5 mg by mouth every evening.       . meloxicam (MOBIC) 7.5 MG tablet Take 7.5 mg by mouth daily.      . Multiple Vitamin (MULTIVITAMIN WITH MINERALS) TABS Take 0.5 tablets by mouth daily.      Marland Kitchen PARoxetine (PAXIL) 10 MG tablet Take 10 mg by mouth daily.      Marland Kitchen rOPINIRole (REQUIP) 1 MG tablet Take 1 mg by mouth at bedtime.      . rosuvastatin (CRESTOR) 20 MG tablet Take 5 mg by mouth every morning.        No current facility-administered medications for this visit.    Allergies as of  09/02/2013  . (No Known Allergies)    Past Medical History  Diagnosis Date  . Syncope     in the past thought possibly vasovagal. no palpitations at this time  . SVT (supraventricular tachycardia)     documented with a rate of 170 BPM 11/09 adenosine treatment converted to regular rhythm patient placed on diltiazem  . Coronary artery disease     taxus stent to the right coronary artery 2005 low risk cardiolite 2008  . Hypertension   . GERD (gastroesophageal reflux disease)   . Primary lateral sclerosis     [facility]  . Spondylolisthesis at L5-S1 level     fusion 2010  . Anemia   . Common biliary duct stricture     Refer to past surgical history. [facility], Dr.  Corliss Parishodd Baron, referred for direct cholangioscopy. Stent remains in since 01/2013.    Past Surgical History  Procedure Laterality Date  . Baclofen pump refill  02/2010  . Angioplasty / stenting femoral  2005  . Cholecystectomy    . Carotid endarterectomy    . Carotid endarterectomy    . Ercp N/A 12/25/2012    RUE:[id number]:Diffusely dilated biliary tree with focal narrowing of the distal common bile duct of uncertain significance/Status post biliary sphincterotomy and balloon dredging with recovery of sludge material as described above/Status post placement of a 10-French, 5 cm biliary stent  . Sphincterotomy N/A 12/25/2012    Procedure: SPHINCTEROTOMY;  Surgeon: Corbin Adeobert M Rourk, MD;  Location: [facility];  Service: Endoscopy;  Laterality: N/A;  . Biliary stent placement N/A 12/25/2012    Procedure: BILIARY STENT PLACEMENT;  Surgeon: Corbin Adeobert M Rourk, MD;  Location: [facility];  Service: Endoscopy;  Laterality: N/A;  . Removal of stones N/A 12/25/2012    Procedure: REMOVAL OF STONES;  Surgeon: Corbin Adeobert M Rourk, MD;  Location: [facility];  Service: Endoscopy;  Laterality: N/A;  . Eus N/A 01/30/2013    Dr. Christella HartiganJacobs: Walls of extrahepatic bile duct thick at 2 mm diffusely, more than usual hypoechoic soft tissue at the distal common bile duct, questionable him representing mass versus prominent ampulla versus reactive from stent. Stent removed and sent to cytology, I do not see any results for this.  . Endoscopic retrograde cholangiopancreatography (ercp) with propofol N/A 01/30/2013    Dr. Christella HartiganJacobs: 2 mm long fixed distal common bile duct stricture located 5 mm proximal to a biliary orifice. Bowel duct brushings were benign. The patient was restented with a plastic biliary stent.    Family History  Problem Relation Age of Onset  . Colon cancer Neg Hx     History   Social History  . Marital Status: Widowed    Spouse Name: N/A    Number of Children: N/A  . Years of Education: N/A   Occupational History  . Not on file.    Social History Main Topics  . Smoking status: Former Smoker -- 1.50 packs/day for 45 years    Types: Cigarettes    Quit date: 04/03/1997  . Smokeless tobacco: Never Used  . Alcohol Use: No  . Drug Use: No  . Sexual Activity: Not on file   Other Topics Concern  . Not on file   Social History Narrative  . No narrative on file      ROS:  General: Negative for anorexia, weight loss, fever, chills, fatigue, weakness. Eyes: Negative for vision changes.  ENT: Negative for hoarseness, difficulty swallowing , nasal  congestion. CV: Negative for chest pain, angina, palpitations, dyspnea on exertion, peripheral edema.  Respiratory: Negative for dyspnea at rest, dyspnea on exertion, cough, sputum, wheezing.  GI: See history of present illness. GU:  Negative for dysuria, hematuria, urinary incontinence, urinary frequency, nocturnal urination.  MS: Negative for joint pain, low back pain.  Derm: Negative for rash or itching.  Neuro: Chronic weakness related to neurological disorder  Psych: Negative for anxiety, depression, suicidal ideation, hallucinations.  Endo: Negative for unusual weight change.  Heme: Negative for bruising or bleeding. Allergy: Negative for rash or hives.    Physical Examination:  BP 151/75  Pulse 60  Temp(Src) 97.3 F (36.3 C) (Oral)  Resp 18  Ht 5\' 6"  (1.676 m)  Wt 161 lb 6.4 oz (73.211 kg)  BMI 26.06 kg/m2   General: Well-nourished, well-developed in no acute distress. Difficulty ambulating but able to get up on exam table with assistance. Head: Normocephalic, atraumatic.   Eyes: Conjunctiva pink, no icterus. Mouth: Oropharyngeal mucosa moist and pink , no lesions erythema or exudate. Neck: Supple without thyromegaly, masses, or lymphadenopathy.  Lungs: Clear to auscultation bilaterally.  Heart: Regular rate and rhythm, no murmurs rubs or gallops.  Abdomen: Bowel sounds are normal, nontender but difficult to examine right upper quadrant due to large  baclofen pump subcutaneous in this region, nondistended, no hepatosplenomegaly or masses, no abdominal bruits or    hernia , no rebound or guarding.   Rectal: Not performed Extremities: No lower extremity edema. No clubbing or deformities.  Neuro: Alert and oriented x 4 , grossly normal neurologically.  Skin: Warm and dry, no rash or jaundice.   Psych: Alert and cooperative, normal mood and affect.  Labs: None available  Imaging Studies: No results found.

## 2013-09-11 NOTE — Progress Notes (Signed)
Quick Note:  Please let patient know his LFTs look good. I discussed with Dr. Jena Gauss. We need to get patient to [facility]/Dr. Corliss Parish for direct cholangioscopy of abnormal bile duct. I discussed at length while patient was in office. Previously did not follow through but patient aware at this point of importance. Please let him know that the procedure he needs is not offered here and Dr. Jena Gauss did not agree to just changing out his stent himself. ______

## 2013-09-16 ENCOUNTER — Telehealth: Payer: Self-pay | Admitting: *Deleted

## 2013-09-16 NOTE — Telephone Encounter (Signed)
Pt's son is returning julie's call. Please advise [phone]

## 2013-09-17 NOTE — Telephone Encounter (Signed)
Tried to call- LMOM 

## 2013-09-18 NOTE — Telephone Encounter (Signed)
Tried to call pt- LMOM 

## 2013-09-19 NOTE — Telephone Encounter (Signed)
Spoke with pt

## 2013-09-22 NOTE — Progress Notes (Signed)
Dr. Corwin LevinsBarons nurse practioner has LMOM on 6/17 for patient to call back to schedule GI procedures but [name] has not responded

## 2013-11-26 NOTE — Telephone Encounter (Signed)
Received documentation from [facility] that they have tried multiple ways and cant get Mr. Nobrega to return their calls

## 2013-11-27 ENCOUNTER — Emergency Department (HOSPITAL_COMMUNITY)
Admission: EM | Admit: 2013-11-27 | Discharge: 2013-11-27 | Disposition: A | Discharge status: Home / Self Care | Payer: Medicare Other | Attending: Emergency Medicine | Admitting: Emergency Medicine

## 2013-11-27 ENCOUNTER — Encounter (HOSPITAL_COMMUNITY): Payer: Self-pay | Admitting: Emergency Medicine

## 2013-11-27 DIAGNOSIS — Z79899 Other long term (current) drug therapy: Secondary | ICD-10-CM | POA: Insufficient documentation

## 2013-11-27 DIAGNOSIS — Z791 Long term (current) use of non-steroidal anti-inflammatories (NSAID): Secondary | ICD-10-CM | POA: Insufficient documentation

## 2013-11-27 DIAGNOSIS — B028 Zoster with other complications: Secondary | ICD-10-CM

## 2013-11-27 DIAGNOSIS — K219 Gastro-esophageal reflux disease without esophagitis: Secondary | ICD-10-CM | POA: Diagnosis not present

## 2013-11-27 DIAGNOSIS — Z7982 Long term (current) use of aspirin: Secondary | ICD-10-CM | POA: Insufficient documentation

## 2013-11-27 DIAGNOSIS — B029 Zoster without complications: Secondary | ICD-10-CM | POA: Diagnosis not present

## 2013-11-27 DIAGNOSIS — Z87891 Personal history of nicotine dependence: Secondary | ICD-10-CM | POA: Diagnosis not present

## 2013-11-27 DIAGNOSIS — I2510 Atherosclerotic heart disease of native coronary artery without angina pectoris: Secondary | ICD-10-CM | POA: Diagnosis not present

## 2013-11-27 DIAGNOSIS — Z8669 Personal history of other diseases of the nervous system and sense organs: Secondary | ICD-10-CM | POA: Insufficient documentation

## 2013-11-27 DIAGNOSIS — R21 Rash and other nonspecific skin eruption: Secondary | ICD-10-CM | POA: Diagnosis present

## 2013-11-27 DIAGNOSIS — L308 Other specified dermatitis: Secondary | ICD-10-CM

## 2013-11-27 DIAGNOSIS — I10 Essential (primary) hypertension: Secondary | ICD-10-CM | POA: Diagnosis not present

## 2013-11-27 MED ORDER — ACYCLOVIR 800 MG PO TABS: 800.0000 mg | ORAL_TABLET | Freq: Every day | ORAL | Status: DC

## 2013-11-27 MED ORDER — HYDROCODONE-ACETAMINOPHEN 5-325 MG PO TABS
1.0000 | ORAL_TABLET | Freq: Four times a day (QID) | ORAL | Status: DC | PRN
Start: 2013-11-27 — End: 2016-03-25

## 2013-11-27 MED ORDER — PREDNISONE 50 MG PO TABS
60.0000 mg | ORAL_TABLET | Freq: Once | ORAL | Status: AC
  Administered 2013-11-27: 12:00:00 60 mg via ORAL
  Filled 2013-11-27 (×2): qty 1

## 2013-11-27 MED ORDER — HYDROCODONE-ACETAMINOPHEN 5-325 MG PO TABS
1.0000 | ORAL_TABLET | Freq: Once | ORAL | Status: AC
  Administered 2013-11-27: 1 via ORAL
  Filled 2013-11-27: qty 1

## 2013-11-27 MED ORDER — PREDNISONE 20 MG PO TABS: ORAL_TABLET | ORAL | Status: DC

## 2013-11-27 NOTE — Discharge Instructions (Signed)
Take the medications as prescribed. Recheck if you get a fever, change in mental status (confused), or you get lesions around your eyes.    Shingles Shingles (herpes zoster) is an infection that is caused by the same virus that causes chickenpox (varicella). The infection causes a painful skin rash and fluid-filled blisters, which eventually break open, crust over, and heal. It may occur in any area of the body, but it usually affects only one side of the body or face. The pain of shingles usually lasts about 1 month. However, some people with shingles may develop long-term (chronic) pain in the affected area of the body. Shingles often occurs many years after the person had chickenpox. It is more common:  In people older than 50 years.  In people with weakened immune systems, such as those with HIV, AIDS, or cancer.  In people taking medicines that weaken the immune system, such as transplant medicines.  In people under great stress. CAUSES  Shingles is caused by the varicella zoster virus (VZV), which also causes chickenpox. After a person is infected with the virus, it can remain in the person's body for years in an inactive state (dormant). To cause shingles, the virus reactivates and breaks out as an infection in a nerve root. The virus can be spread from person to person (contagious) through contact with open blisters of the shingles rash. It will only spread to people who have not had chickenpox. When these people are exposed to the virus, they may develop chickenpox. They will not develop shingles. Once the blisters scab over, the person is no longer contagious and cannot spread the virus to others. SIGNS AND SYMPTOMS  Shingles shows up in stages. The initial symptoms may be pain, itching, and tingling in an area of the skin. This pain is usually described as burning, stabbing, or throbbing.In a few days or weeks, a painful red rash will appear in the area where the pain, itching, and  tingling were felt. The rash is usually on one side of the body in a band or belt-like pattern. Then, the rash usually turns into fluid-filled blisters. They will scab over and dry up in approximately 2-3 weeks. Flu-like symptoms may also occur with the initial symptoms, the rash, or the blisters. These may include:  Fever.  Chills.  Headache.  Upset stomach. DIAGNOSIS  Your health care provider will perform a skin exam to diagnose shingles. Skin scrapings or fluid samples may also be taken from the blisters. This sample will be examined under a microscope or sent to a lab for further testing. TREATMENT  There is no specific cure for shingles. Your health care provider will likely prescribe medicines to help you manage the pain, recover faster, and avoid long-term problems. This may include antiviral drugs, anti-inflammatory drugs, and pain medicines. HOME CARE INSTRUCTIONS   Take a cool bath or apply cool compresses to the area of the rash or blisters as directed. This may help with the pain and itching.   Take medicines only as directed by your health care provider.   Rest as directed by your health care provider.  Keep your rash and blisters clean with mild soap and cool water or as directed by your health care provider.  Do not pick your blisters or scratch your rash. Apply an anti-itch cream or numbing creams to the affected area as directed by your health care provider.  Keep your shingles rash covered with a loose bandage (dressing).  Avoid skin contact with:  Babies.   Pregnant women.   Children with eczema.   Elderly people with transplants.   People with chronic illnesses, such as leukemia or AIDS.   Wear loose-fitting clothing to help ease the pain of material rubbing against the rash.  Keep all follow-up visits as directed by your health care provider.If the area involved is on your face, you may receive a referral for a specialist, such as an eye doctor  (ophthalmologist) or an ear, nose, and throat (ENT) doctor. Keeping all follow-up visits will help you avoid eye problems, chronic pain, or disability.  SEEK IMMEDIATE MEDICAL CARE IF:   You have facial pain, pain around the eye area, or loss of feeling on one side of your face.  You have ear pain or ringing in your ear.  You have loss of taste.  Your pain is not relieved with prescribed medicines.   Your redness or swelling spreads.   You have more pain and swelling.  Your condition is worsening or has changed.   You have a fever. MAKE SURE YOU:  Understand these instructions.  Will watch your condition.  Will get help right away if you are not doing well or get worse. Document Released: 03/20/2005 Document Revised: 08/04/2013 Document Reviewed: 11/02/2011 [facility] Patient Information 2015 [place], [place]. This information is not intended to replace advice given to you by your health care provider. Make sure you discuss any questions you have with your health care provider.

## 2013-11-27 NOTE — ED Provider Notes (Signed)
CSN: [id number]     Arrival date & time 11/27/13  1033 History   This chart was scribed for Lucas Givens, MD, by Yevette Edwards, ED Scribe. This patient was seen in room APA10/APA10 and the patient's care was started at 11:13 AM.  First MD Initiated Contact with Patient 11/27/13 1100     Chief Complaint  Patient presents with  . Rash  . Abdominal Pain    The history is provided by the patient. No language interpreter was used.   HPI Comments: [name] is a 78 y.o. male, with primary lateral sclerosis, who presents to the Emergency Department complaining of a worsening rash to his chest. He states he started having pain in his right posterior chest which began approximately 10 days ago and then it spread to his right anterior chest. The rash began to his back and has spread to his chest and started yesterday. He endorses associated pain and itching. He has a h/o chicken-pox in the past. He denies a h/o shingles, though he reports his mother had shingles twice.   [name] also reports he removed a tick from his lower abdomen yesterday. He believes he was exposed to the tick from his chihuahua.   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 His neurologist is Dr. Chauncey Cruel at [facility] in [place].   Dr. Lysbeth Galas is the pt's PCP. The pt has been on hydrocodone for several yeears; he ran out several weeks ago. The caregiver reports a 30-day supply often lasts Mr. Delmonaco five months.   He is a former smoker.   Past Medical History  Diagnosis Date  . Syncope     in the past thought possibly vasovagal. no palpitations at this time  . SVT (supraventricular tachycardia)     documented with a rate of 170 BPM 11/09 adenosine treatment converted to regular rhythm patient placed on diltiazem  . Coronary artery disease     taxus stent to the right coronary artery 2005 low risk cardiolite 2008  . Hypertension   . GERD (gastroesophageal reflux disease)    . Primary lateral sclerosis     [facility]  . Spondylolisthesis at L5-S1 level     fusion 2010  . Anemia   . Common biliary duct stricture     Refer to past surgical history. Patient failed to show up to [facility], Dr. Corliss Parish, referred for direct cholangioscopy. Stent remains in since 01/2013.   Past Surgical History  Procedure Laterality Date  . Baclofen pump refill  02/2010  . Angioplasty / stenting femoral  2005  . Cholecystectomy    . Carotid endarterectomy    . Carotid endarterectomy    . Ercp N/A 12/25/2012    WUJ:[id number] dilated biliary tree with focal narrowing of the distal common bile duct of uncertain significance/Status post biliary sphincterotomy and balloon dredging with recovery of sludge material as described above/Status post placement of a 10-French, 5 cm biliary stent  . Sphincterotomy N/A 12/25/2012    Procedure: SPHINCTEROTOMY;  Surgeon: Corbin Ade, MD;  Location: [facility];  Service: Endoscopy;  Laterality: N/A;  . Biliary stent placement N/A 12/25/2012    Procedure: BILIARY STENT PLACEMENT;  Surgeon: Corbin Ade, MD;  Location: [facility];  Service: Endoscopy;  Laterality: N/A;  . Removal of stones N/A 12/25/2012    Procedure: REMOVAL OF STONES;  Surgeon: Corbin Ade, MD;  Location: [facility];  Service:  Endoscopy;  Laterality: N/A;  . Eus N/A 01/30/2013    Dr. Christella Hartigan: Walls of extrahepatic bile duct thick at 2 mm diffusely, more than usual hypoechoic soft tissue at the distal common bile duct, questionable him representing mass versus prominent ampulla versus reactive from stent. Stent removed and sent to cytology, I do not see any results for this.  . Endoscopic retrograde cholangiopancreatography (ercp) with propofol N/A 01/30/2013    Dr. Christella Hartigan: 2 mm long fixed distal common bile duct stricture located 5 mm proximal to a biliary orifice. Bowel duct brushings were benign. The patient was restented with a plastic biliary stent.   Family History   Problem Relation Age of Onset  . Colon cancer Neg Hx    History  Substance Use Topics  . Smoking status: Former Smoker -- 1.50 packs/day for 45 years    Types: Cigarettes    Quit date: 04/03/1997  . Smokeless tobacco: Never Used  . Alcohol Use: No  lives at home Uses a walker and Wheelchair Son takes care of patient.   Review of Systems  Constitutional: Negative for fever.  Musculoskeletal: Positive for myalgias.  Skin: Positive for rash.  All other systems reviewed and are negative.   Allergies  Review of patient's allergies indicates no known allergies.  Home Medications   Prior to Admission medications   Medication Sig Start Date End Date Taking? Authorizing Provider  aspirin EC 81 MG tablet Take 81 mg by mouth every morning.     Historical Provider, MD  Copper Gluconate (COPPER CAPS PO) Take 3 capsules by mouth 3 (three) times daily.    Historical Provider, MD  diltiazem (CARDIZEM CD) 240 MG 24 hr capsule Take 240 mg by mouth every morning.     Historical Provider, MD  esomeprazole (NEXIUM) 40 MG capsule Take 40 mg by mouth daily before breakfast.    Historical Provider, MD  fish oil-omega-3 fatty acids 1000 MG capsule Take 2 g by mouth daily.    Historical Provider, MD  HYDROcodone-acetaminophen (NORCO/VICODIN) 5-325 MG per tablet Take 0.5 tablets by mouth every 6 (six) hours as needed for pain.     Historical Provider, MD  lisinopril (PRINIVIL,ZESTRIL) 5 MG tablet Take 5 mg by mouth every evening.     Historical Provider, MD  meloxicam (MOBIC) 7.5 MG tablet Take 7.5 mg by mouth daily.    Historical Provider, MD  Multiple Vitamin (MULTIVITAMIN WITH MINERALS) TABS Take 0.5 tablets by mouth daily.    Historical Provider, MD  PARoxetine (PAXIL) 10 MG tablet Take 10 mg by mouth daily.    Historical Provider, MD  rOPINIRole (REQUIP) 1 MG tablet Take 1 mg by mouth at bedtime.    Historical Provider, MD  rosuvastatin (CRESTOR) 20 MG tablet Take 5 mg by mouth every morning.      Historical Provider, MD   Triage Vitals: BP 186/100  Pulse 72  Temp(Src) 97.7 F (36.5 C) (Oral)  Resp 18  SpO2 97%  Vital signs normal except hypertension   Physical Exam  Nursing note and vitals reviewed. Constitutional: He is oriented to person, place, and time. He appears well-developed and well-nourished.  Non-toxic appearance. He does not appear ill. No distress.  HENT:  Head: Normocephalic and atraumatic.  Right Ear: External ear normal.  Left Ear: External ear normal.  Nose: Nose normal. No mucosal edema or rhinorrhea.  Mouth/Throat: Oropharynx is clear and moist and mucous membranes are normal. No dental abscesses or uvula swelling.  Eyes: Conjunctivae and EOM are  normal. Pupils are equal, round, and reactive to light.  Neck: Normal range of motion and full passive range of motion without pain. Neck supple.  Cardiovascular: Normal rate, regular rhythm and normal heart sounds.  Exam reveals no gallop and no friction rub.   No murmur heard. Pulmonary/Chest: Effort normal and breath sounds normal. No respiratory distress. He has no wheezes. He has no rhonchi. He has no rales. He exhibits no tenderness and no crepitus.  Abdominal: Soft. Normal appearance and bowel sounds are normal. He exhibits no distension. There is no tenderness. There is no rebound and no guarding.  Musculoskeletal: He exhibits no edema and no tenderness.  Neurological: He is alert and oriented to person, place, and time. He has normal strength. No cranial nerve deficit.  Slow speech.   Skin: Skin is warm, dry and intact. Rash noted. No erythema. No pallor.  Large clustering of redness to right posterior back. It extends around medially to his chest on the right.  Distribution of T 4/5.    Psychiatric: He has a normal mood and affect. His speech is normal and behavior is normal. His mood appears not anxious.         ED Course  Procedures (including critical care time)  Medications  predniSONE  (DELTASONE) tablet 60 mg (60 mg Oral Given 11/27/13 1145)  HYDROcodone-acetaminophen (NORCO/VICODIN) 5-325 MG per tablet 1 tablet (1 tablet Oral Given 11/27/13 1145)     DIAGNOSTIC STUDIES: Oxygen Saturation is 97% on room air, normal by my interpretation.    COORDINATION OF CARE:  11:21 AM- Discussed treatment plan with patient and the pt's caregiver, and they agreed to the plan. Explained to the pt and his caregiver the progress of shingles. Will prescribe the pt medication. Given precautions about being around pregnant women, patients getting chemotherapy or having chronic illnesses.      Labs Review Labs Reviewed - No data to display  Imaging Review No results found.   EKG Interpretation None      MDM   Final diagnoses:  Shingles  Herpes zoster dermatitis   Discharge Medication List as of 11/27/2013 11:42 AM    START taking these medications   Details  acyclovir (ZOVIRAX) 800 MG tablet Take 1 tablet (800 mg total) by mouth 5 (five) times daily., Starting 11/27/2013, Until Discontinued, Print    !! HYDROcodone-acetaminophen (NORCO/VICODIN) 5-325 MG per tablet Take 1 tablet by mouth every 6 (six) hours as needed for moderate pain., Starting 11/27/2013, Until Discontinued, Print    predniSONE (DELTASONE) 20 MG tablet Take 3 po QD x 2d starting tomorrow, then 2 po QD x 3d then 1 po QD x 3d, Print     !! - Potential duplicate medications found. Please discuss with provider.      Plan discharge    I personally performed the services described in this documentation, which was scribed in my presence. The recorded information has been reviewed and considered.  Devoria Albe, MD, FACEP    Lucas Givens, MD 11/27/13 [phone]

## 2013-11-27 NOTE — ED Notes (Signed)
Rash to right side of chest for several days with pain.

## 2016-03-25 ENCOUNTER — Encounter (HOSPITAL_COMMUNITY): Payer: Self-pay | Admitting: *Deleted

## 2016-03-25 ENCOUNTER — Emergency Department (HOSPITAL_COMMUNITY): Payer: Medicare Other

## 2016-03-25 ENCOUNTER — Inpatient Hospital Stay (HOSPITAL_COMMUNITY)
Admission: EM | Admit: 2016-03-25 | Discharge: 2016-03-29 | DRG: 445 | Disposition: A | Discharge status: Home / Self Care | Payer: Medicare Other | Attending: Internal Medicine | Admitting: Internal Medicine

## 2016-03-25 DIAGNOSIS — G1223 Primary lateral sclerosis: Secondary | ICD-10-CM | POA: Diagnosis present

## 2016-03-25 DIAGNOSIS — I471 Supraventricular tachycardia, unspecified: Secondary | ICD-10-CM | POA: Diagnosis present

## 2016-03-25 DIAGNOSIS — R748 Abnormal levels of other serum enzymes: Secondary | ICD-10-CM

## 2016-03-25 DIAGNOSIS — D696 Thrombocytopenia, unspecified: Secondary | ICD-10-CM | POA: Diagnosis present

## 2016-03-25 DIAGNOSIS — Z955 Presence of coronary angioplasty implant and graft: Secondary | ICD-10-CM

## 2016-03-25 DIAGNOSIS — N179 Acute kidney failure, unspecified: Secondary | ICD-10-CM | POA: Diagnosis present

## 2016-03-25 DIAGNOSIS — E860 Dehydration: Secondary | ICD-10-CM | POA: Diagnosis present

## 2016-03-25 DIAGNOSIS — D649 Anemia, unspecified: Secondary | ICD-10-CM | POA: Diagnosis present

## 2016-03-25 DIAGNOSIS — Z7982 Long term (current) use of aspirin: Secondary | ICD-10-CM

## 2016-03-25 DIAGNOSIS — Z79899 Other long term (current) drug therapy: Secondary | ICD-10-CM

## 2016-03-25 DIAGNOSIS — R1084 Generalized abdominal pain: Secondary | ICD-10-CM

## 2016-03-25 DIAGNOSIS — Z9689 Presence of other specified functional implants: Secondary | ICD-10-CM | POA: Diagnosis present

## 2016-03-25 DIAGNOSIS — M62838 Other muscle spasm: Secondary | ICD-10-CM | POA: Diagnosis present

## 2016-03-25 DIAGNOSIS — K8031 Calculus of bile duct with cholangitis, unspecified, with obstruction: Principal | ICD-10-CM

## 2016-03-25 DIAGNOSIS — I2510 Atherosclerotic heart disease of native coronary artery without angina pectoris: Secondary | ICD-10-CM | POA: Diagnosis present

## 2016-03-25 DIAGNOSIS — Z87891 Personal history of nicotine dependence: Secondary | ICD-10-CM

## 2016-03-25 DIAGNOSIS — K839 Disease of biliary tract, unspecified: Secondary | ICD-10-CM | POA: Diagnosis present

## 2016-03-25 DIAGNOSIS — Z9889 Other specified postprocedural states: Secondary | ICD-10-CM

## 2016-03-25 DIAGNOSIS — Z7401 Bed confinement status: Secondary | ICD-10-CM

## 2016-03-25 DIAGNOSIS — Z66 Do not resuscitate: Secondary | ICD-10-CM | POA: Diagnosis present

## 2016-03-25 DIAGNOSIS — I10 Essential (primary) hypertension: Secondary | ICD-10-CM | POA: Diagnosis present

## 2016-03-25 DIAGNOSIS — K831 Obstruction of bile duct: Secondary | ICD-10-CM

## 2016-03-25 DIAGNOSIS — Z993 Dependence on wheelchair: Secondary | ICD-10-CM

## 2016-03-25 DIAGNOSIS — K219 Gastro-esophageal reflux disease without esophagitis: Secondary | ICD-10-CM | POA: Diagnosis present

## 2016-03-25 LAB — CBC
HCT: 44.5 % (ref 39.0–52.0)
HEMOGLOBIN: 14.4 g/dL (ref 13.0–17.0)
MCH: 29.1 pg (ref 26.0–34.0)
MCHC: 32.4 g/dL (ref 30.0–36.0)
MCV: 89.9 fL (ref 78.0–100.0)
Platelets: 233 10*3/uL (ref 150–400)
RBC: 4.95 MIL/uL (ref 4.22–5.81)
RDW: 14.1 % (ref 11.5–15.5)
WBC: 6.3 10*3/uL (ref 4.0–10.5)

## 2016-03-25 LAB — COMPREHENSIVE METABOLIC PANEL
ALT: 159 U/L — ABNORMAL HIGH (ref 17–63)
AST: 383 U/L — AB (ref 15–41)
Albumin: 4.4 g/dL (ref 3.5–5.0)
Alkaline Phosphatase: 200 U/L — ABNORMAL HIGH (ref 38–126)
Anion gap: 16 — ABNORMAL HIGH (ref 5–15)
BUN: 17 mg/dL (ref 6–20)
CHLORIDE: 101 mmol/L (ref 101–111)
CO2: 26 mmol/L (ref 22–32)
Calcium: 10 mg/dL (ref 8.9–10.3)
Creatinine, Ser: 1.47 mg/dL — ABNORMAL HIGH (ref 0.61–1.24)
GFR calc Af Amer: 51 mL/min — ABNORMAL LOW (ref >60)
GFR calc non Af Amer: 44 mL/min — ABNORMAL LOW (ref >60)
Glucose, Bld: 182 mg/dL — ABNORMAL HIGH (ref 65–99)
POTASSIUM: 4 mmol/L (ref 3.5–5.1)
SODIUM: 143 mmol/L (ref 135–145)
Total Bilirubin: 1.1 mg/dL (ref 0.3–1.2)
Total Protein: 8 g/dL (ref 6.5–8.1)

## 2016-03-25 LAB — LIPASE, BLOOD: LIPASE: 20 U/L (ref 11–51)

## 2016-03-25 MED ORDER — ONDANSETRON HCL 4 MG/2ML IJ SOLN
4.0000 mg | Freq: Once | INTRAMUSCULAR | Status: AC | PRN
  Administered 2016-03-25: 4 mg via INTRAVENOUS
  Filled 2016-03-25: qty 2

## 2016-03-25 MED ORDER — SODIUM CHLORIDE 0.9 % IV BOLUS (SEPSIS)
1000.0000 mL | Freq: Once | INTRAVENOUS | Status: AC
  Administered 2016-03-25: 1000 mL via INTRAVENOUS

## 2016-03-25 MED ORDER — SODIUM CHLORIDE 0.9 % IV BOLUS (SEPSIS)
500.0000 mL | Freq: Once | INTRAVENOUS | Status: AC
  Administered 2016-03-25: 500 mL via INTRAVENOUS

## 2016-03-25 MED ORDER — ONDANSETRON HCL 4 MG/2ML IJ SOLN: 4.0000 mg | Freq: Once | INTRAMUSCULAR | Status: DC

## 2016-03-25 NOTE — ED Triage Notes (Signed)
Pt reported abdominal pain around 7:30 pm and son gave him baking soda water and then pt ate a hamburger and fries. Pt vomiting while being triaged. Son also states that this pt is having bilateral leg pain but this is a chronic issue. Pt needed maximum  assistance getting out of the car and into the bed. Son states that the pt normally walks with a walker. Pt has a baclofen pump for gait disturbance. Primary lateral sclerosis.

## 2016-03-25 NOTE — ED Provider Notes (Addendum)
[facility]ER[facility]Y DEPT Provider Note    By signing my name below, I, Earmon PhoenixJennifer Waddell, attest that this documentation has been prepared under the direction and in the presence of Devoria AlbeIva Eulanda Dorion, MD. Electronically Signed: Earmon PhoenixJennifer Waddell, ED Scribe. 03/26/16. 12:54 AM  Time seen 23:05 PM  History   Chief Complaint Chief Complaint  Patient presents with  . Emesis    The history is provided by the patient, medical records and a relative. No language interpreter was used.    HPI Comments:  [name] is a 80 y.o. male with PMH of CAD, GERD, HTN and ALS who presents to the Emergency Department complaining of resolving generalized abdominal pain that began approximately five hours ago after waking from a nap. He reports associated constipation, nausea and one episode of emesis. Pt's son states he gave him some baking soda water that made him belch numerous times and relieved his symptoms temporarily. He then ate a burger and the symptoms returned. He denies any modifying factors. He denies diarrhea, fever, chills. He reports a cholecystectomy and Baclofen pump insertion. He walks with a walker at baseline. \ PCP Josue HectorNYLAND,LEONARD ROBERT, MD   Past Medical History:  Diagnosis Date  . Anemia   . Common biliary duct stricture    Refer to past surgical history. [facility] Dr. Corliss Parishodd Baron, referred for direct cholangioscopy. Stent remains in since 01/2013.  Marland Kitchen. Coronary artery disease    taxus stent to the right coronary artery 2005 low risk cardiolite 2008  . GERD (gastroesophageal reflux disease)   . Hypertension   . Primary lateral sclerosis    [facility]  . Spondylolisthesis at L5-S1 level    fusion 2010  . SVT (supraventricular tachycardia) (HCC)    documented with a rate of 170 BPM 11/09 adenosine treatment converted to regular rhythm patient placed on diltiazem  . Syncope    in the past thought possibly vasovagal. no palpitations at this time     Patient Active Problem List   Diagnosis Date Noted  . Biliary stricture 09/02/2013  . History of biliary stent insertion 09/02/2013  . Nonspecific (abnormal) findings on radiological and other examination of biliary tract 01/30/2013  . Paroxysmal supraventricular tachycardia (HCC) 12/27/2012  . Blood-tinged sputum 12/26/2012  . Acute cholangitis 12/24/2012  . Jaundice 12/24/2012  . Anemia 12/24/2012  . Diverticulosis 12/24/2012  . Right inguinal hernia 12/24/2012  . Esophagitis 12/24/2012  . HOH (hard of hearing) 12/24/2012  . Coronary artery disease   . Hypertension   . GERD (gastroesophageal reflux disease)   . Primary lateral sclerosis   . Spondylolisthesis at L5-S1 level     Past Surgical History:  Procedure Laterality Date  . ANGIOPLASTY / STENTING FEMORAL  2005  . BACLOFEN PUMP REFILL  02/2010  . BILIARY STENT PLACEMENT N/A 12/25/2012   Procedure: BILIARY STENT PLACEMENT;  Surgeon: Corbin Adeobert M Rourk, MD;  Location: [facility] ORS;  Service: Endoscopy;  Laterality: N/A;  . CAROTID ENDARTERECTOMY    . CAROTID ENDARTERECTOMY    . CHOLECYSTECTOMY    . ENDOSCOPIC RETROGRADE CHOLANGIOPANCREATOGRAPHY (ERCP) WITH PROPOFOL N/A 01/30/2013   Dr. Christella HartiganJacobs: 2 mm long fixed distal common bile duct stricture located 5 mm proximal to a biliary orifice. Bowel duct brushings were benign. The patient was restented with a plastic biliary stent.  Marland Kitchen. ERCP N/A 12/25/2012   GNF:AOZHYQMVHRMR:Diffusely dilated biliary tree with focal narrowing of the distal common bile duct of uncertain significance/Status post biliary sphincterotomy  and balloon dredging with recovery of sludge material as described above/Status post placement of a 10-French, 5 cm biliary stent  . EUS N/A 01/30/2013   Dr. Christella Hartigan: Walls of extrahepatic bile duct thick at 2 mm diffusely, more than usual hypoechoic soft tissue at the distal common bile duct, questionable him representing mass versus prominent ampulla versus reactive from stent. Stent removed  and sent to cytology, I do not see any results for this.  Marland Kitchen REMOVAL OF STONES N/A 12/25/2012   Procedure: REMOVAL OF STONES;  Surgeon: Corbin Ade, MD;  Location: [facility] ORS;  Service: Endoscopy;  Laterality: N/A;  . SPHINCTEROTOMY N/A 12/25/2012   Procedure: SPHINCTEROTOMY;  Surgeon: Corbin Ade, MD;  Location: [facility];  Service: Endoscopy;  Laterality: N/A;       Home Medications    Prior to Admission medications   Medication Sig Start Date End Date Taking? Authorizing Provider  aspirin EC 81 MG tablet Take 81 mg by mouth every morning.    Yes Historical Provider, MD  Copper Gluconate (COPPER CAPS PO) Take 1 capsule by mouth 3 (three) times daily.    Yes Historical Provider, MD  diltiazem (CARDIZEM CD) 240 MG 24 hr capsule Take 240 mg by mouth every morning.    Yes Historical Provider, MD  esomeprazole (NEXIUM) 40 MG capsule Take 40 mg by mouth daily before breakfast.   Yes Historical Provider, MD  fish oil-omega-3 fatty acids 1000 MG capsule Take 2 g by mouth daily.   Yes Historical Provider, MD  lisinopril (PRINIVIL,ZESTRIL) 5 MG tablet Take 5 mg by mouth every evening.    Yes Historical Provider, MD  meloxicam (MOBIC) 7.5 MG tablet Take 7.5 mg by mouth daily.   Yes Historical Provider, MD  Multiple Vitamin (MULTIVITAMIN WITH MINERALS) TABS Take 0.5 tablets by mouth daily.   Yes Historical Provider, MD  PARoxetine (PAXIL) 10 MG tablet Take 10 mg by mouth daily.   Yes Historical Provider, MD  rOPINIRole (REQUIP) 1 MG tablet Take 1 mg by mouth at bedtime.   Yes Historical Provider, MD  rosuvastatin (CRESTOR) 5 MG tablet Take 5 mg by mouth every morning.    Yes Historical Provider, MD    Family History Family History  Problem Relation Age of Onset  . Colon cancer Neg Hx     Social History Social History  Substance Use Topics  . Smoking status: Former Smoker    Packs/day: 1.50    Years: 45.00    Types: Cigarettes    Quit date: 04/03/1997  . Smokeless tobacco: Never Used  .  Alcohol use No  lives with son Uses a walker   Allergies   Patient has no known allergies.   Review of Systems Review of Systems  Constitutional: Negative for chills and fever.  Gastrointestinal: Positive for abdominal pain, constipation, nausea and vomiting. Negative for diarrhea.  All other systems reviewed and are negative.    Physical Exam Updated Vital Signs BP 135/73 (BP Location: Left Arm)   Pulse 113   Resp (!) 38   Ht 5\' 6"  (1.676 m)   Wt 161 lb (73 kg)   SpO2 100%   BMI 25.99 kg/m   Vital signs normal except hypertension, tachycardia   Physical Exam  Constitutional: He is oriented to person, place, and time. He appears well-developed and well-nourished.  Non-toxic appearance. He does not appear ill. No distress.  HENT:  Head: Normocephalic and atraumatic.  Right Ear: External ear normal.  Left Ear: External ear  normal.  Nose: Nose normal. No mucosal edema or rhinorrhea.  Mouth/Throat: Oropharynx is clear and moist. Mucous membranes are dry. No dental abscesses or uvula swelling.  Tongue dry. Edentulous.  Eyes: Conjunctivae and EOM are normal. Pupils are equal, round, and reactive to light.  Neck: Normal range of motion and full passive range of motion without pain. Neck supple.  Cardiovascular: Normal rate, regular rhythm and normal heart sounds.  Exam reveals no gallop and no friction rub.   No murmur heard. Pulmonary/Chest: Effort normal and breath sounds normal. No respiratory distress. He has no wheezes. He has no rhonchi. He has no rales. He exhibits no tenderness and no crepitus.  Abdominal: Soft. Normal appearance and bowel sounds are normal. He exhibits no distension. There is tenderness. There is no rebound and no guarding.  Abdomen feels tight. Diffuse tenderness to palpation. Baclofen pump in RUQ  Musculoskeletal: Normal range of motion. He exhibits no edema or tenderness.  Moves all extremities well.   Neurological: He is alert and oriented to  person, place, and time. He has normal strength. No cranial nerve deficit.  Skin: Skin is warm, dry and intact. No rash noted. No erythema. No pallor.  Psychiatric: He has a normal mood and affect. His speech is normal and behavior is normal. His mood appears not anxious.  Nursing note and vitals reviewed.    ED Treatments / Results  DIAGNOSTIC STUDIES: Oxygen Saturation is 100% on RA, normal by my interpretation.      Labs (all labs ordered are listed, but only abnormal results are displayed) Results for orders placed or performed during the hospital encounter of 03/25/16  Lipase, blood  Result Value Ref Range   Lipase 20 11 - 51 U/L  Comprehensive metabolic panel  Result Value Ref Range   Sodium 143 135 - 145 mmol/L   Potassium 4.0 3.5 - 5.1 mmol/L   Chloride 101 101 - 111 mmol/L   CO2 26 22 - 32 mmol/L   Glucose, Bld 182 (H) 65 - 99 mg/dL   BUN 17 6 - 20 mg/dL   Creatinine, Ser 1.611.47 (H) 0.61 - 1.24 mg/dL   Calcium 09.610.0 8.9 - 04.510.3 mg/dL   Total Protein 8.0 6.5 - 8.1 g/dL   Albumin 4.4 3.5 - 5.0 g/dL   AST 409383 (H) 15 - 41 U/L   ALT 159 (H) 17 - 63 U/L   Alkaline Phosphatase 200 (H) 38 - 126 U/L   Total Bilirubin 1.1 0.3 - 1.2 mg/dL   GFR calc non Af Amer 44 (L) >60 mL/min   GFR calc Af Amer 51 (L) >60 mL/min   Anion gap 16 (H) 5 - 15  CBC  Result Value Ref Range   WBC 6.3 4.0 - 10.5 K/uL   RBC 4.95 4.22 - 5.81 MIL/uL   Hemoglobin 14.4 13.0 - 17.0 g/dL   HCT 81.144.5 91.439.0 - 78.252.0 %   MCV 89.9 78.0 - 100.0 fL   MCH 29.1 26.0 - 34.0 pg   MCHC 32.4 30.0 - 36.0 g/dL   RDW 95.614.1 21.311.5 - 08.615.5 %   Platelets 233 150 - 400 K/uL  Urinalysis, Routine w reflex microscopic  Result Value Ref Range   Color, Urine AMBER (A) YELLOW   APPearance CLEAR CLEAR   Specific Gravity, Urine 1.013 1.005 - 1.030   pH 6.0 5.0 - 8.0   Glucose, UA NEGATIVE NEGATIVE mg/dL   Hgb urine dipstick NEGATIVE NEGATIVE   Bilirubin Urine NEGATIVE NEGATIVE   Ketones,  ur NEGATIVE NEGATIVE mg/dL   Protein,  ur NEGATIVE NEGATIVE mg/dL   Nitrite NEGATIVE NEGATIVE   Leukocytes, UA NEGATIVE NEGATIVE   Laboratory interpretation all normal except New elevation of LFTs, renal insufficiency    EKG  EKG Interpretation None       Radiology Ct Abdomen Pelvis W Contrast  Result Date: 03/26/2016 CLINICAL DATA:  Abdominal pain EXAM: CT ABDOMEN AND PELVIS WITH CONTRAST TECHNIQUE: Multidetector CT imaging of the abdomen and pelvis was performed using the standard protocol following bolus administration of intravenous contrast. CONTRAST:  ISOVUE-300 IOPAMIDOL (ISOVUE-300) INJECTION 61%, 30mL ISOVUE-300 IOPAMIDOL (ISOVUE-300) INJECTION 61% COMPARISON:  CT abdomen pelvis 12/24/2012 FINDINGS: Lower chest: There is a partially calcified plaque of the left lung base. There is noncalcified plaque of the anterior right pleura.r there is a small fluid collection adjacent to the esophagus (series 2, image 17), measuring 1.5 x 2.0 cm. Hepatobiliary: There is intrahepatic biliary dilatation. The common bile duct is also dilated. There is a stent within the common bile duct, terminating in the duodenum. The gallbladder is surgically absent. Pancreas: There is senescent fatty atrophy of the pancreas. Spleen: Normal. Adrenals/Urinary Tract: Normal adrenal glands. No hydronephrosis or solid renal mass. Stomach/Bowel: There is sigmoid diverticulosis without acute inflammation. No abdominal fluid collection. Normal appendix. Vascular/Lymphatic: There is atherosclerotic calcification of the non aneurysmal abdominal aorta. No abdominal or pelvic adenopathy. Reproductive: Normal prostate and seminal vesicles. Musculoskeletal: There is lumbosacral fusion hardware with associated disc spacer material. There is mild subsidence at the inferior endplate of L5. There is grade 1 anterolisthesis at this level. There is a spinal stimulator device with its battery pack in the subcutaneous tissues of the right lower quadrant. Unchanged T12  compression fracture. Other: There is fluid in the right inguinal canal, within a fat containing inguinal hernia. IMPRESSION: 1. Intrahepatic and extrahepatic biliary dilatation with stent within the common bile duct. This study is not able to demonstrate whether the stent is patent. Further evaluation with ERCP, MRI with biliary specific contrast agent or cholangiography may be helpful. 2. Multiple pleural plaques, minimally changed compared to 12/07/2010, likely indicating prior asbestos exposure. 3. Small, fat and fluid containing right inguinal hernia. 4. Aortic atherosclerosis. Electronically Signed   By: Deatra Robinson M.D.   On: 03/26/2016 05:15   Dg Abdomen Acute W/chest  Result Date: 03/25/2016 CLINICAL DATA:  Generalized abdominal pain and vomiting, onset this evening. EXAM: DG ABDOMEN ACUTE W/ 1V CHEST COMPARISON:  12/26/2012 FINDINGS: Bile duct stent. Cholecystectomy clips. The abdominal gas pattern is negative for obstruction or perforation. No biliary or urinary calculi are evident. Implanted stimulator superimposes the right abdomen. The upright view of the chest demonstrates stable mild lung base opacities, possibly scarring or atelectasis. No consolidation. No large effusions. IMPRESSION: No radiographic evidence of bowel obstruction or perforation. No acute findings in the chest. Electronically Signed   By: Ellery Plunk M.D.   On: 03/25/2016 23:55    Procedures Procedures (including critical care time)  Medications Ordered in ED Medications  ondansetron (ZOFRAN) injection 4 mg (not administered)  LORazepam (ATIVAN) tablet 1 mg (not administered)  ondansetron (ZOFRAN) injection 4 mg (4 mg Intravenous Given 03/25/16 2252)  sodium chloride 0.9 % bolus 1,000 mL (0 mLs Intravenous Stopped 03/26/16 0057)  sodium chloride 0.9 % bolus 500 mL (0 mLs Intravenous Stopped 03/26/16 0000)  iopamidol (ISOVUE-300) 61 % injection (30 mLs  Contrast Given 03/26/16 0425)  iopamidol (ISOVUE-300)  61 % injection 100 mL (100 mLs Intravenous Contrast  Given 03/26/16 0425)     Initial Impression / Assessment and Plan / ED Course  I have reviewed the triage vital signs and the nursing notes.  Pertinent labs & imaging results that were available during my care of the patient were reviewed by me and considered in my medical decision making (see chart for details).  Clinical Course    Laboratory testing and x-rays were ordered.  Recheck at 12:50 AM pt is pain-free and denies nausea or vomiting. He just gave a urine sample. Patient is agreeable to drink fluids.  02:15 AM  Patient continued to state he felt fine. He had no more abdominal pain or nausea. Patient drink oral fluids without having return of his symptoms. Part of patient's care was delayed due to some other critical patients. I was going to discharge patient however when I reviewed his labs are noted he had a new acute onset of significant elevation of his liver tests. Ultrasound is not available tonight. Patient is status post cholecystectomy. CT scan was ordered.   5:35 AM I talked to patient's son. We are going to [facility] MRI of his abdomen to see if his stent is still functional. Patient has a baclofen pump however son states he is supposed to be able to have a MRI with this pump. He states it will stop while he's an MRI and will start again once he is out. He states his father will need some oral Ativan prior to having MRI.  06:40 AM Dr Preston Fleeting, ED at [facility] made aware of patient coming to MRI, should hopefully return to [facility] unless he needs GI intervention.    Final Clinical Impressions(s) / ED Diagnoses   Final diagnoses:  Generalized abdominal pain  Elevated liver enzymes    Disposition--Pt going to [facility] for MRI, if stent is nonfunctional will need GI consult, if functioning can return to [facility] for discharge.    I personally performed the services described in this documentation, which was scribed in my  presence. The recorded information has been reviewed and considered.  Devoria Albe, MD, Concha Pyo, MD 03/26/16 [id number]    Devoria Albe, MD 03/26/16 1191    Devoria Albe, MD 03/26/16 [phone]

## 2016-03-25 NOTE — ED Notes (Signed)
Pt vomiting when I entered the room. Pt complaining of abdominal pain all over that started tonight.

## 2016-03-26 ENCOUNTER — Ambulatory Visit (HOSPITAL_COMMUNITY): Admit: 2016-03-26 | Payer: Medicare Other

## 2016-03-26 ENCOUNTER — Emergency Department (HOSPITAL_COMMUNITY): Payer: Medicare Other

## 2016-03-26 DIAGNOSIS — Z79899 Other long term (current) drug therapy: Secondary | ICD-10-CM | POA: Diagnosis not present

## 2016-03-26 DIAGNOSIS — D649 Anemia, unspecified: Secondary | ICD-10-CM | POA: Diagnosis not present

## 2016-03-26 DIAGNOSIS — I471 Supraventricular tachycardia: Secondary | ICD-10-CM | POA: Diagnosis present

## 2016-03-26 DIAGNOSIS — K839 Disease of biliary tract, unspecified: Secondary | ICD-10-CM | POA: Diagnosis present

## 2016-03-26 DIAGNOSIS — K831 Obstruction of bile duct: Secondary | ICD-10-CM | POA: Diagnosis not present

## 2016-03-26 DIAGNOSIS — R932 Abnormal findings on diagnostic imaging of liver and biliary tract: Secondary | ICD-10-CM | POA: Diagnosis not present

## 2016-03-26 DIAGNOSIS — Z4659 Encounter for fitting and adjustment of other gastrointestinal appliance and device: Secondary | ICD-10-CM | POA: Diagnosis not present

## 2016-03-26 DIAGNOSIS — D696 Thrombocytopenia, unspecified: Secondary | ICD-10-CM | POA: Diagnosis present

## 2016-03-26 DIAGNOSIS — Z7401 Bed confinement status: Secondary | ICD-10-CM | POA: Diagnosis not present

## 2016-03-26 DIAGNOSIS — K219 Gastro-esophageal reflux disease without esophagitis: Secondary | ICD-10-CM | POA: Diagnosis present

## 2016-03-26 DIAGNOSIS — Z87891 Personal history of nicotine dependence: Secondary | ICD-10-CM | POA: Diagnosis not present

## 2016-03-26 DIAGNOSIS — R1084 Generalized abdominal pain: Secondary | ICD-10-CM | POA: Diagnosis present

## 2016-03-26 DIAGNOSIS — K8031 Calculus of bile duct with cholangitis, unspecified, with obstruction: Secondary | ICD-10-CM | POA: Diagnosis present

## 2016-03-26 DIAGNOSIS — M62838 Other muscle spasm: Secondary | ICD-10-CM | POA: Diagnosis not present

## 2016-03-26 DIAGNOSIS — Z9889 Other specified postprocedural states: Secondary | ICD-10-CM | POA: Diagnosis not present

## 2016-03-26 DIAGNOSIS — G1223 Primary lateral sclerosis: Secondary | ICD-10-CM | POA: Diagnosis present

## 2016-03-26 DIAGNOSIS — Z993 Dependence on wheelchair: Secondary | ICD-10-CM | POA: Diagnosis not present

## 2016-03-26 DIAGNOSIS — N179 Acute kidney failure, unspecified: Secondary | ICD-10-CM | POA: Diagnosis present

## 2016-03-26 DIAGNOSIS — Z7982 Long term (current) use of aspirin: Secondary | ICD-10-CM | POA: Diagnosis not present

## 2016-03-26 DIAGNOSIS — Z66 Do not resuscitate: Secondary | ICD-10-CM | POA: Diagnosis present

## 2016-03-26 DIAGNOSIS — I2510 Atherosclerotic heart disease of native coronary artery without angina pectoris: Secondary | ICD-10-CM | POA: Diagnosis present

## 2016-03-26 DIAGNOSIS — Z9689 Presence of other specified functional implants: Secondary | ICD-10-CM | POA: Diagnosis not present

## 2016-03-26 DIAGNOSIS — R17 Unspecified jaundice: Secondary | ICD-10-CM | POA: Diagnosis not present

## 2016-03-26 DIAGNOSIS — Z955 Presence of coronary angioplasty implant and graft: Secondary | ICD-10-CM | POA: Diagnosis not present

## 2016-03-26 DIAGNOSIS — I10 Essential (primary) hypertension: Secondary | ICD-10-CM | POA: Diagnosis present

## 2016-03-26 DIAGNOSIS — E860 Dehydration: Secondary | ICD-10-CM | POA: Diagnosis present

## 2016-03-26 LAB — CREATININE, SERUM
CREATININE: 1.36 mg/dL — AB (ref 0.61–1.24)
GFR calc non Af Amer: 48 mL/min — ABNORMAL LOW (ref >60)
GFR, EST AFRICAN AMERICAN: 56 mL/min — AB (ref >60)

## 2016-03-26 LAB — URINALYSIS, ROUTINE W REFLEX MICROSCOPIC
BILIRUBIN URINE: NEGATIVE
GLUCOSE, UA: NEGATIVE mg/dL
Hgb urine dipstick: NEGATIVE
KETONES UR: NEGATIVE mg/dL
Leukocytes, UA: NEGATIVE
Nitrite: NEGATIVE
PH: 6 (ref 5.0–8.0)
Protein, ur: NEGATIVE mg/dL
SPECIFIC GRAVITY, URINE: 1.013 (ref 1.005–1.030)

## 2016-03-26 LAB — CBC
HEMATOCRIT: 37.2 % — AB (ref 39.0–52.0)
Hemoglobin: 12.6 g/dL — ABNORMAL LOW (ref 13.0–17.0)
MCH: 29.4 pg (ref 26.0–34.0)
MCHC: 33.9 g/dL (ref 30.0–36.0)
MCV: 86.7 fL (ref 78.0–100.0)
Platelets: 164 10*3/uL (ref 150–400)
RBC: 4.29 MIL/uL (ref 4.22–5.81)
RDW: 14.7 % (ref 11.5–15.5)
WBC: 19.5 10*3/uL — AB (ref 4.0–10.5)

## 2016-03-26 MED ORDER — ONDANSETRON HCL 4 MG PO TABS: 4.0000 mg | ORAL_TABLET | Freq: Four times a day (QID) | ORAL | Status: DC | PRN

## 2016-03-26 MED ORDER — LORAZEPAM 1 MG PO TABS: 1.0000 mg | ORAL_TABLET | Freq: Once | ORAL | Status: DC

## 2016-03-26 MED ORDER — DEXTROSE-NACL 5-0.45 % IV SOLN
INTRAVENOUS | Status: DC
  Administered 2016-03-26: 16:00:00 via INTRAVENOUS
  Administered 2016-03-27: 1000 mL via INTRAVENOUS
  Administered 2016-03-27 – 2016-03-28 (×2): via INTRAVENOUS

## 2016-03-26 MED ORDER — IOPAMIDOL (ISOVUE-300) INJECTION 61%
100.0000 mL | Freq: Once | INTRAVENOUS | Status: AC | PRN
  Administered 2016-03-26: 100 mL via INTRAVENOUS

## 2016-03-26 MED ORDER — PANTOPRAZOLE SODIUM 40 MG PO TBEC
40.0000 mg | DELAYED_RELEASE_TABLET | Freq: Every day | ORAL | Status: DC
  Administered 2016-03-26 – 2016-03-29 (×4): 40 mg via ORAL
  Filled 2016-03-26 (×4): qty 1

## 2016-03-26 MED ORDER — ASPIRIN EC 81 MG PO TBEC
81.0000 mg | DELAYED_RELEASE_TABLET | Freq: Every morning | ORAL | Status: DC
  Administered 2016-03-27 – 2016-03-29 (×3): 81 mg via ORAL
  Filled 2016-03-26 (×3): qty 1

## 2016-03-26 MED ORDER — ACETAMINOPHEN 325 MG PO TABS
650.0000 mg | ORAL_TABLET | Freq: Four times a day (QID) | ORAL | Status: DC | PRN
Start: 2016-03-26 — End: 2016-03-29

## 2016-03-26 MED ORDER — HEPARIN SODIUM (PORCINE) 5000 UNIT/ML IJ SOLN
5000.0000 [IU] | Freq: Three times a day (TID) | INTRAMUSCULAR | Status: DC
  Administered 2016-03-26 – 2016-03-29 (×9): 5000 [IU] via SUBCUTANEOUS
  Filled 2016-03-26 (×10): qty 1

## 2016-03-26 MED ORDER — ROPINIROLE HCL 1 MG PO TABS
1.0000 mg | ORAL_TABLET | Freq: Every day | ORAL | Status: DC
  Administered 2016-03-26 – 2016-03-28 (×3): 1 mg via ORAL
  Filled 2016-03-26 (×3): qty 1

## 2016-03-26 MED ORDER — DILTIAZEM HCL ER COATED BEADS 240 MG PO CP24
240.0000 mg | ORAL_CAPSULE | Freq: Every morning | ORAL | Status: DC
  Administered 2016-03-27 – 2016-03-29 (×3): 240 mg via ORAL
  Filled 2016-03-26 (×3): qty 1

## 2016-03-26 MED ORDER — MORPHINE SULFATE (PF) 2 MG/ML IV SOLN
1.0000 mg | INTRAVENOUS | Status: DC | PRN
  Administered 2016-03-27 (×2): 1 mg via INTRAVENOUS
  Filled 2016-03-26 (×2): qty 1

## 2016-03-26 MED ORDER — ROSUVASTATIN CALCIUM 5 MG PO TABS
5.0000 mg | ORAL_TABLET | Freq: Every morning | ORAL | Status: DC
  Administered 2016-03-27 – 2016-03-29 (×3): 5 mg via ORAL
  Filled 2016-03-26 (×3): qty 1

## 2016-03-26 MED ORDER — AMPICILLIN-SULBACTAM SODIUM 3 (2-1) G IJ SOLR
3.0000 g | Freq: Three times a day (TID) | INTRAMUSCULAR | Status: DC
  Administered 2016-03-26 – 2016-03-29 (×8): 3 g via INTRAVENOUS
  Filled 2016-03-26 (×9): qty 3

## 2016-03-26 MED ORDER — ALBUTEROL SULFATE (2.5 MG/3ML) 0.083% IN NEBU: 2.5000 mg | INHALATION_SOLUTION | RESPIRATORY_TRACT | Status: DC | PRN

## 2016-03-26 MED ORDER — ACETAMINOPHEN 650 MG RE SUPP: 650.0000 mg | Freq: Four times a day (QID) | RECTAL | Status: DC | PRN

## 2016-03-26 MED ORDER — ONDANSETRON HCL 4 MG/2ML IJ SOLN: 4.0000 mg | Freq: Four times a day (QID) | INTRAMUSCULAR | Status: DC | PRN

## 2016-03-26 MED ORDER — IOPAMIDOL (ISOVUE-300) INJECTION 61%
INTRAVENOUS | Status: AC
  Administered 2016-03-26: 30 mL
  Filled 2016-03-26: qty 30

## 2016-03-26 NOTE — ED Notes (Signed)
Dr. Adriana Simasook in to speak with pt and family regarding [facility]

## 2016-03-26 NOTE — Progress Notes (Signed)
Phlebotomy attempted lab stick and could not obtain. Lab rescheduled for 6pm.

## 2016-03-26 NOTE — ED Notes (Signed)
Pt sleeping again. Family says drank water provided & went back to sleep.

## 2016-03-26 NOTE — Progress Notes (Signed)
Patient arrived to unit via [facility]. VSS. Alert. Placed on cardiac monitor. Patient and Son updated about plan of care and oriented to room. Dr.Hobbs paged to make aware of patient's arrival and need for orders.

## 2016-03-26 NOTE — ED Notes (Signed)
[facility] called asking what type of pump pt had. Per son it is a medtronic pump. Number for medtronic for questions [phone]

## 2016-03-26 NOTE — Consult Note (Signed)
Reason for Consult: Clogged biliary stent Referring Physician: [facility]  [name] HPI: This is a 80 year old male with a PMH of a distal biliary stricture, history of cholangitis, GERD, HTN, lateral sclerosis and CAD admitted for worsening abdominal pain.  Upon further evaluation in the ER he was identified to have an elevation his liver enzymes.  He has a history of biliary stricture and he was first evaluated by Dr. Jena Gaussourk on 12/26/2015 with an ERCP.  The CBD was diffusely dilated and balloon sweep of the CBD was significant for sludge.  There was diffuse dilation of the CBD and a smooth stricturing in the distal CBD.  A stent was placed after the sphincterotomy and balloon sweep.  He was referred to Dr. Christella HartiganJacobs and on 01/30/2013 he underwent an EUS and repeat ERCP with stent placement.  Brushings of the distal CBD were negative for malignancy.  Dr. Christella HartiganJacobs replaced his stent and recommended Spyglass for further work up.  Per the records, Dr. Jena Gaussourk attempted to contact the patient on several occassions [facility] Spy[facility], but he was not able to get in touch.  [facility].  From the records, it does not appear that any stent change was performed since Dr. Larae GroomsJacob's performed the ERCP.  Past Medical History:  Diagnosis Date  . Anemia   . Common biliary duct stricture    Refer to past surgical history. [facility], Dr. Corliss Parishodd Baron, referred for direct cholangioscopy. Stent remains in since 01/2013.  Marland Kitchen. Coronary artery disease    taxus stent to the right coronary artery 2005 low risk cardiolite 2008  . GERD (gastroesophageal reflux disease)   . Hypertension   . Primary lateral sclerosis    [facility]  . Spondylolisthesis at L5-S1 level    fusion 2010  . SVT (supraventricular tachycardia) (HCC)    documented with a rate of 170 BPM 11/09 adenosine treatment converted to regular rhythm  patient placed on diltiazem  . Syncope    in the past thought possibly vasovagal. no palpitations at this time    Past Surgical History:  Procedure Laterality Date  . ANGIOPLASTY / STENTING FEMORAL  2005  . BACLOFEN PUMP REFILL  02/2010  . BILIARY STENT PLACEMENT N/A 12/25/2012   Procedure: BILIARY STENT PLACEMENT;  Surgeon: Corbin Adeobert M Rourk, MD;  Location: [facility];  Service: Endoscopy;  Laterality: N/A;  . CAROTID ENDARTERECTOMY    . CAROTID ENDARTERECTOMY    . CHOLECYSTECTOMY    . ENDOSCOPIC RETROGRADE CHOLANGIOPANCREATOGRAPHY (ERCP) WITH PROPOFOL N/A 01/30/2013   Dr. Christella HartiganJacobs: 2 mm long fixed distal common bile duct stricture located 5 mm proximal to a biliary orifice. Bowel duct brushings were benign. The patient was restented with a plastic biliary stent.  Marland Kitchen. ERCP N/A 12/25/2012   ZOX:[id number]:Diffusely dilated biliary tree with focal narrowing of the distal common bile duct of uncertain significance/Status post biliary sphincterotomy and balloon dredging with recovery of sludge material as described above/Status post placement of a 10-French, 5 cm biliary stent  . EUS N/A 01/30/2013   Dr. Christella HartiganJacobs: Walls of extrahepatic bile duct thick at 2 mm diffusely, more than usual hypoechoic soft tissue at the distal common bile duct, questionable him representing mass versus prominent ampulla versus reactive from stent. Stent removed and sent to cytology, I do not see any results for this.  Marland Kitchen. REMOVAL OF STONES N/A 12/25/2012  Procedure: REMOVAL OF STONES;  Surgeon: Corbin Ade, MD;  Location: [facility];  Service: Endoscopy;  Laterality: N/A;  . SPHINCTEROTOMY N/A 12/25/2012   Procedure: SPHINCTEROTOMY;  Surgeon: Corbin Ade, MD;  Location: [facility];  Service: Endoscopy;  Laterality: N/A;    Family History  Problem Relation Age of Onset  . Colon cancer Neg Hx     Social History:  reports that he quit smoking about 18 years ago. His smoking use included Cigarettes. He has a 67.50 pack-year smoking history. He  has never used smokeless tobacco. He reports that he does not drink alcohol or use drugs.  Allergies: No Known Allergies  Medications:  Scheduled: . ampicillin-sulbactam (UNASYN) IV  3 g Intravenous Q8H  . [START ON 03/27/2016] aspirin EC  81 mg Oral q morning - 10a  . [START ON 03/27/2016] diltiazem  240 mg Oral q morning - 10a  . heparin  5,000 Units Subcutaneous Q8H  . LORazepam  1 mg Oral Once  . ondansetron (ZOFRAN) IV  4 mg Intravenous Once  . pantoprazole  40 mg Oral Daily  . rOPINIRole  1 mg Oral QHS  . [START ON 03/27/2016] rosuvastatin  5 mg Oral q morning - 10a   Continuous: . dextrose 5 % and 0.45% NaCl 75 mL/hr at 03/26/16 1625    Results for orders placed or performed during the hospital encounter of 03/25/16 (from the past 24 hour(s))  Lipase, blood     Status: None   Collection Time: 03/25/16 10:44 PM  Result Value Ref Range   Lipase 20 11 - 51 U/L  Comprehensive metabolic panel     Status: Abnormal   Collection Time: 03/25/16 10:44 PM  Result Value Ref Range   Sodium 143 135 - 145 mmol/L   Potassium 4.0 3.5 - 5.1 mmol/L   Chloride 101 101 - 111 mmol/L   CO2 26 22 - 32 mmol/L   Glucose, Bld 182 (H) 65 - 99 mg/dL   BUN 17 6 - 20 mg/dL   Creatinine, Ser 4.54 (H) 0.61 - 1.24 mg/dL   Calcium 09.8 8.9 - 11.9 mg/dL   Total Protein 8.0 6.5 - 8.1 g/dL   Albumin 4.4 3.5 - 5.0 g/dL   AST 147 (H) 15 - 41 U/L   ALT 159 (H) 17 - 63 U/L   Alkaline Phosphatase 200 (H) 38 - 126 U/L   Total Bilirubin 1.1 0.3 - 1.2 mg/dL   GFR calc non Af Amer 44 (L) >60 mL/min   GFR calc Af Amer 51 (L) >60 mL/min   Anion gap 16 (H) 5 - 15  CBC     Status: None   Collection Time: 03/25/16 10:44 PM  Result Value Ref Range   WBC 6.3 4.0 - 10.5 K/uL   RBC 4.95 4.22 - 5.81 MIL/uL   Hemoglobin 14.4 13.0 - 17.0 g/dL   HCT 82.9 56.2 - 13.0 %   MCV 89.9 78.0 - 100.0 fL   MCH 29.1 26.0 - 34.0 pg   MCHC 32.4 30.0 - 36.0 g/dL   RDW 86.5 78.4 - 69.6 %   Platelets 233 150 - 400 K/uL   Urinalysis, Routine w reflex microscopic     Status: Abnormal   Collection Time: 03/26/16  2:20 AM  Result Value Ref Range   Color, Urine AMBER (A) YELLOW   APPearance CLEAR CLEAR   Specific Gravity, Urine 1.013 1.005 - 1.030   pH 6.0 5.0 - 8.0   Glucose, UA NEGATIVE  NEGATIVE mg/dL   Hgb urine dipstick NEGATIVE NEGATIVE   Bilirubin Urine NEGATIVE NEGATIVE   Ketones, ur NEGATIVE NEGATIVE mg/dL   Protein, ur NEGATIVE NEGATIVE mg/dL   Nitrite NEGATIVE NEGATIVE   Leukocytes, UA NEGATIVE NEGATIVE  CBC     Status: Abnormal   Collection Time: 03/26/16  6:11 PM  Result Value Ref Range   WBC 19.5 (H) 4.0 - 10.5 K/uL   RBC 4.29 4.22 - 5.81 MIL/uL   Hemoglobin 12.6 (L) 13.0 - 17.0 g/dL   HCT 16.137.2 (L) 09.639.0 - 04.552.0 %   MCV 86.7 78.0 - 100.0 fL   MCH 29.4 26.0 - 34.0 pg   MCHC 33.9 30.0 - 36.0 g/dL   RDW 40.914.7 81.111.5 - 91.415.5 %   Platelets 164 150 - 400 K/uL  Creatinine, serum     Status: Abnormal   Collection Time: 03/26/16  6:11 PM  Result Value Ref Range   Creatinine, Ser 1.36 (H) 0.61 - 1.24 mg/dL   GFR calc non Af Amer 48 (L) >60 mL/min   GFR calc Af Amer 56 (L) >60 mL/min     Ct Abdomen Pelvis W Contrast  Addendum Date: 03/26/2016   ADDENDUM REPORT: 03/26/2016 10:37 ADDENDUM: Review requested to assess for utility of MRI given the metallic device placement along the right mid abdominal wall, which would lead to susceptibility artifact across portions of the liver and common bile duct. On additional review, there is a 1.4 x 2.4 cm spiculated soft tissue lesion adjacent to the distal CBD (series 2/image 42). There is associated wall thickening/enhancement of the distal CBD (series 2/image 41). There are four small upper abdominal nodes in the periportal region measuring 10-11 mm short axis (series 2/images 33, 35, and 37). This overall appearance is highly concerning for extrahepatic cholangiocarcinoma, less likely pancreatic cancer. In this context, MRI is unlikely to provide additional  utility. Even if the lesion was not obscured by susceptibility artifact, the MRI would not obviate the need for potential tissue diagnosis. Consider ERCP/EUS for further evaluation and tissue diagnosis, as clinically warranted given the patient's additional comorbidities. Electronically Signed   By: Charline BillsSriyesh  Krishnan M.D.   On: 03/26/2016 10:37   Result Date: 03/26/2016 CLINICAL DATA:  Abdominal pain EXAM: CT ABDOMEN AND PELVIS WITH CONTRAST TECHNIQUE: Multidetector CT imaging of the abdomen and pelvis was performed using the standard protocol following bolus administration of intravenous contrast. CONTRAST:  100mL ISOVUE-300 IOPAMIDOL (ISOVUE-300) INJECTION 61%, 30mL ISOVUE-300 IOPAMIDOL (ISOVUE-300) INJECTION 61% COMPARISON:  CT abdomen pelvis 12/24/2012 FINDINGS: Lower chest: There is a partially calcified plaque of the left lung base. There is noncalcified plaque of the anterior right pleura.r there is a small fluid collection adjacent to the esophagus (series 2, image 17), measuring 1.5 x 2.0 cm. Hepatobiliary: There is intrahepatic biliary dilatation. The common bile duct is also dilated. There is a stent within the common bile duct, terminating in the duodenum. The gallbladder is surgically absent. Pancreas: There is senescent fatty atrophy of the pancreas. Spleen: Normal. Adrenals/Urinary Tract: Normal adrenal glands. No hydronephrosis or solid renal mass. Stomach/Bowel: There is sigmoid diverticulosis without acute inflammation. No abdominal fluid collection. Normal appendix. Vascular/Lymphatic: There is atherosclerotic calcification of the non aneurysmal abdominal aorta. No abdominal or pelvic adenopathy. Reproductive: Normal prostate and seminal vesicles. Musculoskeletal: There is lumbosacral fusion hardware with associated disc spacer material. There is mild subsidence at the inferior endplate of L5. There is grade 1 anterolisthesis at this level. There is a spinal stimulator device with its  battery  pack in the subcutaneous tissues of the right lower quadrant. Unchanged T12 compression fracture. Other: There is fluid in the right inguinal canal, within a fat containing inguinal hernia. IMPRESSION: 1. Intrahepatic and extrahepatic biliary dilatation with stent within the common bile duct. This study is not able to demonstrate whether the stent is patent. Further evaluation with ERCP, MRI with biliary specific contrast agent or cholangiography may be helpful. 2. Multiple pleural plaques, minimally changed compared to 12/07/2010, likely indicating prior asbestos exposure. 3. Small, fat and fluid containing right inguinal hernia. 4. Aortic atherosclerosis. Electronically Signed: By: Deatra Robinson M.D. On: 03/26/2016 05:15   Dg Abdomen Acute W/chest  Result Date: 03/25/2016 CLINICAL DATA:  Generalized abdominal pain and vomiting, onset this evening. EXAM: DG ABDOMEN ACUTE W/ 1V CHEST COMPARISON:  12/26/2012 FINDINGS: Bile duct stent. Cholecystectomy clips. The abdominal gas pattern is negative for obstruction or perforation. No biliary or urinary calculi are evident. Implanted stimulator superimposes the right abdomen. The upright view of the chest demonstrates stable mild lung base opacities, possibly scarring or atelectasis. No consolidation. No large effusions. IMPRESSION: No radiographic evidence of bowel obstruction or perforation. No acute findings in the chest. Electronically Signed   By: Ellery Plunk M.D.   On: 03/25/2016 23:55    ROS:  As stated above in the HPI otherwise negative.  Blood pressure 97/60, pulse (!) 101, temperature 99 F (37.2 C), temperature source Oral, resp. rate (!) 22, height 5\' 6"  (1.676 m), weight 74.5 kg (164 lb 4.8 oz), SpO2 100 %.    PE: Gen: NAD, Alert and Oriented HEENT:  [place]/AT, EOMI Neck: Supple, no LAD Lungs: CTA Bilaterally CV: RRR without M/G/R ABM: Soft, mild epigastric tenderness, +BS Ext: No C/C/E  Assessment/Plan: 1) Obstructed biliary  stent. 2) Abnormal liver enzymes. 3) Primary lateral sclerosis.   When [facility]irs [facility] to [facility] his symptoms resolved, but the recommendation was for him to undergo further treatment with a stent exchange.  His WBC was normal and he was not febrile.  Since the time of transfer his WBC increased to 18,000, but no fever.  Unasyn was started.  His WBC dropped down to 16.  He has some mild epigastric tenderness.  Plan: 1) ERCP with stent replacement on Tuesday unless he acutely worsens. 2) Continue with Unasyn.  Barbar Brede D 03/26/2016, 9:34 PM

## 2016-03-26 NOTE — ED Notes (Signed)
Pt still resting w/ eyes closed. Family informed if pt wakes up we need for him to try & give a urine sample.

## 2016-03-26 NOTE — ED Notes (Signed)
Pt resting at this time.  Denies any needs.  Son at bedside.

## 2016-03-26 NOTE — Progress Notes (Signed)
Pharmacy Antibiotic Note  Rilyn R Augustine RadarOverby is a 80 y.o. male admitted on 03/25/2016 with intra-abdominal infection.  Pharmacy has been consulted for Unasyn dosing.  Plan: Unasyn 3g IV q8h Follow c/s, clinical progression, renal function  Height: 5\' 6"  (167.6 cm) Weight: 164 lb 4.8 oz (74.5 kg) IBW/kg (Calculated) : 63.8  Temp (24hrs), Avg:97.9 F (36.6 C), Min:97.9 F (36.6 C), Max:97.9 F (36.6 C)   Recent Labs Lab 03/25/16 2244  WBC 6.3  CREATININE 1.47*    Estimated Creatinine Clearance: 37.4 mL/min (by C-G formula based on SCr of 1.47 mg/dL (H)).    No Known Allergies  Antimicrobials this admission: Unasyn 12/24 >>   Dose adjustments this admission: n/a  Microbiology results: None sent  Thank you for allowing pharmacy to be a part of this patient's care.  Derra Shartzer D. Raychell Holcomb, PharmD, BCPS Clinical Pharmacist Pager: [phone] 03/26/2016 3:58 PM

## 2016-03-26 NOTE — ED Notes (Signed)
Pt given water to drink at this time. 

## 2016-03-26 NOTE — Progress Notes (Signed)
[place] received [place], [place] for admission to [phone] [facility]

## 2016-03-26 NOTE — ED Notes (Signed)
Pt O2 sats dropping to 85% while sleeping. Pt placed on 2l O2 by nasal canula. sats returned to 93%.

## 2016-03-26 NOTE — H&P (Addendum)
TRH H&P   Patient Demographics:    [name], is a 80 y.o. male  MRN: [id number]   DOB - 1935-05-02  Admit Date - 03/25/2016  Outpatient Primary MD for the patient is Josue Hector, MD  Outpatient Specialists: GI physician at [facility], cardiologist Dr. Myrtis Ser    Patient coming from: Home  Chief Complaint  Patient presents with  . Emesis      HPI:    [name]  is a 80 y.o. 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  I discussed his case with GI physician on-call Dr. Jeani Hawking who thinks that patient will require ERCP and should be transferred to [facility] which will be done, besides mild nonradiating generalized/right upper quadrant abdominal pain patient has no other complaints.    Review of systems:    In addition to the HPI above,   No Fever-chills, No Headache, No changes with Vision or hearing, No problems swallowing food or Liquids, No Chest pain, Cough or Shortness of Breath, As above Abdominal pain, No Nausea or Vommitting, Bowel movements are regular, No Blood in stool or Urine, No dysuria, No new skin  rashes or bruises, No new joints pains-aches,  No new weakness, tingling, numbness in any extremity, No recent weight gain or loss, No polyuria, polydypsia or polyphagia, No significant Mental Stressors.  A full 10 point Review of Systems was done, except as stated above, all other Review of Systems were negative.   With Past History of the following :    Past Medical History:  Diagnosis Date  . Anemia   . Common biliary duct stricture    Refer to past surgical history. Patient failed to show up to [facility], Dr. Corliss Parish, referred for direct cholangioscopy. Stent remains in since 01/2013.  Marland Kitchen Coronary artery disease  taxus stent to the right coronary artery 2005 low risk cardiolite 2008  . GERD (gastroesophageal reflux disease)   . Hypertension   . Primary lateral sclerosis    [facility]  . Spondylolisthesis at L5-S1 level    fusion 2010  . SVT (supraventricular tachycardia) (HCC)    documented with a rate of 170 BPM 11/09 adenosine treatment converted to regular rhythm patient placed on diltiazem  . Syncope    in the past thought possibly vasovagal. no palpitations at this time      Past Surgical History:  Procedure Laterality Date  . ANGIOPLASTY / STENTING FEMORAL  2005  . BACLOFEN PUMP REFILL  02/2010  . BILIARY STENT PLACEMENT N/A 12/25/2012   Procedure: BILIARY STENT PLACEMENT;  Surgeon: Corbin Adeobert M Rourk, MD;  Location: [facility];  Service: Endoscopy;  Laterality: N/A;  . CAROTID ENDARTERECTOMY    . CAROTID ENDARTERECTOMY    . CHOLECYSTECTOMY    . ENDOSCOPIC RETROGRADE CHOLANGIOPANCREATOGRAPHY (ERCP) WITH PROPOFOL N/A 01/30/2013   Dr. Christella HartiganJacobs: 2 mm long fixed distal common bile duct stricture located 5 mm proximal to a biliary orifice. Bowel duct brushings were benign. The patient was restented with a plastic biliary stent.  Marland Kitchen. ERCP N/A 12/25/2012   ZOX:[id number]:Diffusely dilated biliary tree with focal narrowing of the distal common bile duct of uncertain  significance/Status post biliary sphincterotomy and balloon dredging with recovery of sludge material as described above/Status post placement of a 10-French, 5 cm biliary stent  . EUS N/A 01/30/2013   Dr. Christella HartiganJacobs: Walls of extrahepatic bile duct thick at 2 mm diffusely, more than usual hypoechoic soft tissue at the distal common bile duct, questionable him representing mass versus prominent ampulla versus reactive from stent. Stent removed and sent to cytology, I do not see any results for this.  Marland Kitchen. REMOVAL OF STONES N/A 12/25/2012   Procedure: REMOVAL OF STONES;  Surgeon: Corbin Adeobert M Rourk, MD;  Location: [facility];  Service: Endoscopy;  Laterality: N/A;  . SPHINCTEROTOMY N/A 12/25/2012   Procedure: SPHINCTEROTOMY;  Surgeon: Corbin Adeobert M Rourk, MD;  Location: [facility];  Service: Endoscopy;  Laterality: N/A;      Social History:     Social History  Substance Use Topics  . Smoking status: Former Smoker    Packs/day: 1.50    Years: 45.00    Types: Cigarettes    Quit date: 04/03/1997  . Smokeless tobacco: Never Used  . Alcohol use No         Family History :     Family History  Problem Relation Age of Onset  . Colon cancer Neg Hx        Home Medications:   Prior to Admission medications   Medication Sig Start Date End Date Taking? Authorizing Provider  aspirin EC 81 MG tablet Take 81 mg by mouth every morning.    Yes Historical Provider, MD  Copper Gluconate (COPPER CAPS PO) Take 1 capsule by mouth 3 (three) times daily.    Yes Historical Provider, MD  diltiazem (CARDIZEM CD) 240 MG 24 hr capsule Take 240 mg by mouth every morning.    Yes Historical Provider, MD  esomeprazole (NEXIUM) 40 MG capsule Take 40 mg by mouth daily before breakfast.   Yes Historical Provider, MD  fish oil-omega-3 fatty acids 1000 MG capsule Take 2 g by mouth daily.   Yes Historical Provider, MD  lisinopril (PRINIVIL,ZESTRIL) 5 MG tablet Take 5 mg by mouth every evening.    Yes Historical  Provider, MD  meloxicam  (MOBIC) 7.5 MG tablet Take 7.5 mg by mouth daily.   Yes Historical Provider, MD  Multiple Vitamin (MULTIVITAMIN WITH MINERALS) TABS Take 0.5 tablets by mouth daily.   Yes Historical Provider, MD  PARoxetine (PAXIL) 10 MG tablet Take 10 mg by mouth daily.   Yes Historical Provider, MD  rOPINIRole (REQUIP) 1 MG tablet Take 1 mg by mouth at bedtime.   Yes Historical Provider, MD  rosuvastatin (CRESTOR) 5 MG tablet Take 5 mg by mouth every morning.    Yes Historical Provider, MD     Allergies:    No Known Allergies   Physical Exam:   Vitals  Blood pressure 114/67, pulse 90, resp. rate 24, height 5\' 6"  (1.676 m), weight 73 kg (161 lb), SpO2 100 %.   1. General Frail elderly white male lying in bed in NAD,     2. Normal affect and insight, Not Suicidal or Homicidal, Awake Alert, Oriented X 3.  3. No F.N deficits, ALL C.Nerves Intact, Strength 4/5 all 4 extremities, Sensation intact all 4 extremities, Plantars down going.  4. Ears and Eyes appear Normal, Conjunctivae clear, PERRLA. Moist Oral Mucosa.  5. Supple Neck, No JVD, No cervical lymphadenopathy appriciated, No Carotid Bruits.  6. Symmetrical Chest wall movement, Good air movement bilaterally, CTAB.  7. RRR, No Gallops, Rubs or Murmurs, No Parasternal Heave.  8. Positive Bowel Sounds, Abdomen Soft, mild right upper quadrant tenderness, No organomegaly appriciated,No rebound -guarding or rigidity. Also has baclofen pump in the right upper quadrant.  9.  No Cyanosis, Normal Skin Turgor, No Skin Rash or Bruise.  10. Good muscle tone,  joints appear normal , no effusions, Normal ROM.  11. No Palpable Lymph Nodes in Neck or Axillae      Data Review:    CBC  Recent Labs Lab 03/25/16 2244  WBC 6.3  HGB 14.4  HCT 44.5  PLT 233  MCV 89.9  MCH 29.1  MCHC 32.4  RDW 14.1   ------------------------------------------------------------------------------------------------------------------  Chemistries   Recent Labs Lab  03/25/16 2244  NA 143  K 4.0  CL 101  CO2 26  GLUCOSE 182*  BUN 17  CREATININE 1.47*  CALCIUM 10.0  AST 383*  ALT 159*  ALKPHOS 200*  BILITOT 1.1   ------------------------------------------------------------------------------------------------------------------ estimated creatinine clearance is 37.4 mL/min (by C-G formula based on SCr of 1.47 mg/dL (H)). ------------------------------------------------------------------------------------------------------------------ No results for input(s): TSH, T4TOTAL, T3FREE, THYROIDAB in the last 72 hours.  Invalid input(s): FREET3  Coagulation profile No results for input(s): INR, PROTIME in the last 168 hours. ------------------------------------------------------------------------------------------------------------------- No results for input(s): DDIMER in the last 72 hours. -------------------------------------------------------------------------------------------------------------------  Cardiac Enzymes No results for input(s): CKMB, TROPONINI, MYOGLOBIN in the last 168 hours.  Invalid input(s): CK ------------------------------------------------------------------------------------------------------------------ No results found for: BNP   ---------------------------------------------------------------------------------------------------------------  Urinalysis    Component Value Date/Time   COLORURINE AMBER (A) 03/26/2016 0220   APPEARANCEUR CLEAR 03/26/2016 0220   LABSPEC 1.013 03/26/2016 0220   PHURINE 6.0 03/26/2016 0220   GLUCOSEU NEGATIVE 03/26/2016 0220   HGBUR NEGATIVE 03/26/2016 0220   BILIRUBINUR NEGATIVE 03/26/2016 0220   KETONESUR NEGATIVE 03/26/2016 0220   PROTEINUR NEGATIVE 03/26/2016 0220   UROBILINOGEN 1.0 12/24/2012 1210   NITRITE NEGATIVE 03/26/2016 0220   LEUKOCYTESUR NEGATIVE 03/26/2016 0220     ----------------------------------------------------------------------------------------------------------------   Imaging Results:    Ct Abdomen Pelvis W Contrast  Result Date: 03/26/2016 CLINICAL DATA:  Abdominal pain EXAM: CT ABDOMEN AND PELVIS WITH CONTRAST TECHNIQUE: Multidetector CT  imaging of the abdomen and pelvis was performed using the standard protocol following bolus administration of intravenous contrast. CONTRAST:  100mL ISOVUE-300 IOPAMIDOL (ISOVUE-300) INJECTION 61%, 30mL ISOVUE-300 IOPAMIDOL (ISOVUE-300) INJECTION 61% COMPARISON:  CT abdomen pelvis 12/24/2012 FINDINGS: Lower chest: There is a partially calcified plaque of the left lung base. There is noncalcified plaque of the anterior right pleura.r there is a small fluid collection adjacent to the esophagus (series 2, image 17), measuring 1.5 x 2.0 cm. Hepatobiliary: There is intrahepatic biliary dilatation. The common bile duct is also dilated. There is a stent within the common bile duct, terminating in the duodenum. The gallbladder is surgically absent. Pancreas: There is senescent fatty atrophy of the pancreas. Spleen: Normal. Adrenals/Urinary Tract: Normal adrenal glands. No hydronephrosis or solid renal mass. Stomach/Bowel: There is sigmoid diverticulosis without acute inflammation. No abdominal fluid collection. Normal appendix. Vascular/Lymphatic: There is atherosclerotic calcification of the non aneurysmal abdominal aorta. No abdominal or pelvic adenopathy. Reproductive: Normal prostate and seminal vesicles. Musculoskeletal: There is lumbosacral fusion hardware with associated disc spacer material. There is mild subsidence at the inferior endplate of L5. There is grade 1 anterolisthesis at this level. There is a spinal stimulator device with its battery pack in the subcutaneous tissues of the right lower quadrant. Unchanged T12 compression fracture. Other: There is fluid in the right inguinal canal, within a fat containing  inguinal hernia. IMPRESSION: 1. Intrahepatic and extrahepatic biliary dilatation with stent within the common bile duct. This study is not able to demonstrate whether the stent is patent. Further evaluation with ERCP, MRI with biliary specific contrast agent or cholangiography may be helpful. 2. Multiple pleural plaques, minimally changed compared to 12/07/2010, likely indicating prior asbestos exposure. 3. Small, fat and fluid containing right inguinal hernia. 4. Aortic atherosclerosis. Electronically Signed   By: Deatra RobinsonKevin  Herman M.D.   On: 03/26/2016 05:15   Dg Abdomen Acute W/chest  Result Date: 03/25/2016 CLINICAL DATA:  Generalized abdominal pain and vomiting, onset this evening. EXAM: DG ABDOMEN ACUTE W/ 1V CHEST COMPARISON:  12/26/2012 FINDINGS: Bile duct stent. Cholecystectomy clips. The abdominal gas pattern is negative for obstruction or perforation. No biliary or urinary calculi are evident. Implanted stimulator superimposes the right abdomen. The upright view of the chest demonstrates stable mild lung base opacities, possibly scarring or atelectasis. No consolidation. No large effusions. IMPRESSION: No radiographic evidence of bowel obstruction or perforation. No acute findings in the chest. Electronically Signed   By: Ellery Plunkaniel R Mitchell M.D.   On: 03/25/2016 23:55    My personal review of EKG: Rhythm S Tach 105/min, no Acute ST changes   Assessment & Plan:     1. Abdominal pain likely due to CBD stricture. Patient has history of the same and has a CBD stent placed a few years ago, usual life of CBD stent is few months, his liver enzymes are trending up specifically alkaline phosphatase, he currently does not have any evidence of active infection, his exam currently is benign. For now we'll keep him nothing by mouth except medications, gentle IV fluids, empirically cover him with Unasyn, have discussed his case with Dr. Elnoria HowardHung GI who will do ERCP [facility]  He  will be a moderate to high risk candidate for perioperative Cardio pulmonary complications due to history of CAD, PLS, generalized deconditioning and largely bedbound status.    2. PLS/motor neuron disease. Strength symmetrically 4/5 in all extremities, he is largely bed/wheelchair bound, has baclofen pump for muscle spasms, continue supportive  care. Lives with son at home.   3. CAD status post stent placement several years ago. No acute issue. No chest pain or loud murmurs, continue aspirin, statin for secondary prevention.   4. History of SVT. On Cardizem continue. Telemetry monitor.   5. GERD. On PPI.   6. ARF. Hydrate, hold ACE inhibitor, repeat electrolytes in the morning.    DVT Prophylaxis Heparin   AM Labs Ordered, also please review Full Orders  Family Communication: Admission, patients condition and plan of care including tests being ordered have been discussed with the patient and son who indicate understanding and agree with the plan and Code Status.  Code Status DNR  Likely DC to  Home 3-4 days  Condition GUARDED    Consults called: Dr Elnoria Howard GI    Admission status: Inpt at [facility]    Time spent in minutes : 35   SINGH,PRASHANT K M.D on 03/26/2016 at 10:12 AM  Between 7am to 7pm - Pager - [phone]. After 7pm go to [email or web address] - password [facility]  [facility] - Office  [phone]

## 2016-03-26 NOTE — ED Notes (Signed)
Pt states still not needing to use the restroom.

## 2016-03-26 NOTE — ED Notes (Signed)
MRI on hold due to questions regarding Baclofen pump.  Contacted physician on call for Dr. Erma HeritageIsaacs (MD handling pump).  The physician on call spoke with Dr. Erma HeritageIsaacs who stated it is fine to perform MRI.  The pump will pause during scan and should resume infusion at previous settings later.  No interrogation needed.  States pt's Baclofen dose is so low if the pump does not resume, there will be no ill effects and he can resume at next office visit.  Relayed this to MRI tech.

## 2016-03-26 NOTE — ED Notes (Signed)
[facility] states they will be here in 30-45 mins to transport pt

## 2016-03-27 DIAGNOSIS — G1223 Primary lateral sclerosis: Secondary | ICD-10-CM

## 2016-03-27 DIAGNOSIS — I10 Essential (primary) hypertension: Secondary | ICD-10-CM

## 2016-03-27 LAB — CBC
HEMATOCRIT: 34.7 % — AB (ref 39.0–52.0)
Hemoglobin: 11.3 g/dL — ABNORMAL LOW (ref 13.0–17.0)
MCH: 28.5 pg (ref 26.0–34.0)
MCHC: 32.6 g/dL (ref 30.0–36.0)
MCV: 87.6 fL (ref 78.0–100.0)
PLATELETS: 141 10*3/uL — AB (ref 150–400)
RBC: 3.96 MIL/uL — ABNORMAL LOW (ref 4.22–5.81)
RDW: 15 % (ref 11.5–15.5)
WBC: 16 10*3/uL — AB (ref 4.0–10.5)

## 2016-03-27 LAB — COMPREHENSIVE METABOLIC PANEL
ALBUMIN: 2.8 g/dL — AB (ref 3.5–5.0)
ALT: 217 U/L — ABNORMAL HIGH (ref 17–63)
AST: 208 U/L — AB (ref 15–41)
Alkaline Phosphatase: 149 U/L — ABNORMAL HIGH (ref 38–126)
Anion gap: 8 (ref 5–15)
BUN: 22 mg/dL — AB (ref 6–20)
CHLORIDE: 105 mmol/L (ref 101–111)
CO2: 25 mmol/L (ref 22–32)
Calcium: 8.4 mg/dL — ABNORMAL LOW (ref 8.9–10.3)
Creatinine, Ser: 1.29 mg/dL — ABNORMAL HIGH (ref 0.61–1.24)
GFR calc Af Amer: 60 mL/min — ABNORMAL LOW (ref >60)
GFR calc non Af Amer: 51 mL/min — ABNORMAL LOW (ref >60)
GLUCOSE: 89 mg/dL (ref 65–99)
POTASSIUM: 4.1 mmol/L (ref 3.5–5.1)
SODIUM: 138 mmol/L (ref 135–145)
Total Bilirubin: 3.3 mg/dL — ABNORMAL HIGH (ref 0.3–1.2)
Total Protein: 5.5 g/dL — ABNORMAL LOW (ref 6.5–8.1)

## 2016-03-27 MED ORDER — SODIUM CHLORIDE 0.9 % IV BOLUS (SEPSIS)
1000.0000 mL | Freq: Once | INTRAVENOUS | Status: AC
  Administered 2016-03-27: 1000 mL via INTRAVENOUS

## 2016-03-27 NOTE — Progress Notes (Signed)
PROGRESS NOTE [facility]   Lucas NewcomerGene R Horn   ZOX:[id number] DOB: [birth year]  DOA: 03/25/2016 PCP: [name]   Brief Narrative: [name]  is a 80 y.o. 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  [facility]   Subjective: Patient seen and examined with son at bedside. No new complaints, abdominal pain has improved. Denies nausea and vomiting. Patient for ERCP in AM   Assessment & Plan: Abdominal pain 2/2 CBD stricture - patient with hx with the same in the past had CBD stent placed.  Liver enzymes elevated but now trending down GI recommendations appreciated - for ERCP in AM NPO after midnight  Continue empiric unasyn  Continue IVF fluid (gentle hydration) High risk for perioperative cardipulm complications due to hx of CAD, PLS and generalize deconditioning  PLS/Motor neuro disease - bed/wheelchair bound, has baclofen pump for muscle spasm Continue current management and supportive treatment    CAD - stable no chest pain  Continue ASA and Statin for secondary prevention   Hx of SVT  Continue tele monitor  Continue Cardizem   AKI - likely 2/2 dehydration - improving with IVF  Continue IVF  Monitor BMP   DVT prophylaxis: Heparin  Code Status: DNR Family Communication: Son at bedside Disposition Plan: Home when medically stable    Consultants:   GI  Procedures:   ERCP in AM   Antimicrobials:  Unasyn 12/25   Objective: Vitals:   03/26/16 1526 03/26/16 2125 03/27/16 0549 03/27/16 1442  BP: (!) 119/51 97/60 (!) 94/55 (!) 102/57  Pulse: 94 (!) 101 (!) 101 73  Resp: 17 (!) 22 20 18   Temp: 97.9 F (36.6 C) 99 F (37.2 C) 98.8 F (37.1 C) 97.8 F (36.6 C)  TempSrc: Oral Oral Oral Oral  SpO2: 98% 100% 91% 94%  Weight: 74.5 kg (164 lb 4.8 oz)     Height: 5\' 6"  (1.676 m)       Intake/Output Summary (Last 24 hours) at 03/27/16 1507 Last data filed at 03/27/16 1047  Gross per 24 hour  Intake           1177.5 ml  Output              625 ml  Net            552.5 ml   Filed Weights   03/25/16 2238 03/26/16 1526  Weight: 73 kg (161 lb) 74.5 kg (164 lb 4.8 oz)    Examination:  General exam: Appears calm and comfortable  Respiratory system: Clear to auscultation. No wheezes,crackle or rhonchi Cardiovascular system: S1 & S2 heard, RRR. No JVD, murmurs, rubs or gallops Gastrointestinal system: Abdomen soft mild tenderness in RUQ. Non distended. Normal bowel sounds heard. Central nervous system: Alert and oriented. No focal neurological deficits. Extremities: No pedal edema. Symmetric, strength 3-4/5   Skin: No rashes, lesion    Data Reviewed: I have personally reviewed following labs  and imaging studies  CBC:  Recent Labs Lab 03/25/16 2244 03/26/16 1811 03/27/16 0440  WBC 6.3 19.5* 16.0*  HGB 14.4 12.6* 11.3*  HCT 44.5 37.2* 34.7*  MCV 89.9 86.7 87.6  PLT 233 164 141*   Basic Metabolic Panel:  Recent Labs Lab 03/25/16 2244 03/26/16 1811 03/27/16 0440  NA 143  --  138  K 4.0  --  4.1  CL 101  --  105  CO2 26  --  25  GLUCOSE 182*  --  89  BUN 17  --  22*  CREATININE 1.47* 1.36* 1.29*  CALCIUM 10.0  --  8.4*   GFR: Estimated Creatinine Clearance: 42.6 mL/min (by C-G formula based on SCr of 1.29 mg/dL (H)). Liver Function Tests:  Recent Labs Lab 03/25/16 2244  03/27/16 0440  AST 383* 208*  ALT 159* 217*  ALKPHOS 200* 149*  BILITOT 1.1 3.3*  PROT 8.0 5.5*  ALBUMIN 4.4 2.8*    Recent Labs Lab 03/25/16 2244  LIPASE 20   No results for input(s): AMMONIA in the last 168 hours. Coagulation Profile: No results for input(s): INR, PROTIME in the last 168 hours. Cardiac Enzymes: No results for input(s): CKTOTAL, CKMB, CKMBINDEX, TROPONINI in the last 168 hours. BNP (last 3 results) No results for input(s): PROBNP in the last 8760 hours. HbA1C: No results for input(s): HGBA1C in the last 72 hours. CBG: No results for input(s): GLUCAP in the last 168 hours. Lipid Profile: No results for input(s): CHOL, HDL, LDLCALC, TRIG, CHOLHDL, LDLDIRECT in the last 72 hours. Thyroid Function Tests: No results for input(s): TSH, T4TOTAL, FREET4, T3FREE, THYROIDAB in the last 72 hours. Anemia Panel: No results for input(s): VITAMINB12, FOLATE, FERRITIN, TIBC, IRON, RETICCTPCT in the last 72 hours. Sepsis Labs: No results for input(s): PROCALCITON, LATICACIDVEN in the last 168 hours.  No results found for this or any previous visit (from the past 240 hour(s)).    Radiology Studies: Ct Abdomen Pelvis W Contrast  Addendum Date: 03/26/2016   ADDENDUM REPORT: 03/26/2016 10:37 ADDENDUM: Review requested to assess for utility of MRI given the metallic device placement along the right mid abdominal wall, which would lead to susceptibility artifact across portions of the liver and common bile duct. On additional review, there is a 1.4 x 2.4 cm spiculated soft tissue lesion adjacent to the distal CBD (series 2/image 42). There is associated wall thickening/enhancement of the distal CBD (series 2/image 41). There are four small upper abdominal nodes in the periportal region measuring 10-11 mm short axis (series 2/images 33, 35, and 37). This overall appearance is highly concerning for extrahepatic cholangiocarcinoma, less likely pancreatic cancer. In this context, MRI  is unlikely to provide additional utility. Even if the lesion was not obscured by susceptibility artifact, the MRI would not obviate the need for potential tissue diagnosis. Consider ERCP/EUS for further evaluation and tissue diagnosis, as clinically warranted given the patient's additional comorbidities. Electronically Signed   By: Charline Bills M.D.   On: 03/26/2016 10:37   Result Date: 03/26/2016 CLINICAL DATA:  Abdominal pain EXAM: CT ABDOMEN AND PELVIS WITH CONTRAST TECHNIQUE: Multidetector CT imaging of the abdomen and pelvis was performed using the standard protocol following bolus administration of intravenous contrast. CONTRAST:  ISOVUE-300 IOPAMIDOL (ISOVUE-300) INJECTION 61%, 30mL ISOVUE-300 IOPAMIDOL (ISOVUE-300) INJECTION 61% COMPARISON:  CT abdomen pelvis 12/24/2012 FINDINGS: Lower chest: There is a partially calcified plaque of the left lung base. There is noncalcified plaque of the anterior right pleura.r there is a small  fluid collection adjacent to the esophagus (series 2, image 17), measuring 1.5 x 2.0 cm. Hepatobiliary: There is intrahepatic biliary dilatation. The common bile duct is also dilated. There is a stent within the common bile duct, terminating in the duodenum. The gallbladder is surgically absent. Pancreas: There is senescent fatty atrophy of the pancreas. Spleen: Normal. Adrenals/Urinary Tract: Normal adrenal glands. No hydronephrosis or solid renal mass. Stomach/Bowel: There is sigmoid diverticulosis without acute inflammation. No abdominal fluid collection. Normal appendix. Vascular/Lymphatic: There is atherosclerotic calcification of the non aneurysmal abdominal aorta. No abdominal or pelvic adenopathy. Reproductive: Normal prostate and seminal vesicles. Musculoskeletal: There is lumbosacral fusion hardware with associated disc spacer material. There is mild subsidence at the inferior endplate of L5. There is grade 1 anterolisthesis at this level. There is a spinal  stimulator device with its battery pack in the subcutaneous tissues of the right lower quadrant. Unchanged T12 compression fracture. Other: There is fluid in the right inguinal canal, within a fat containing inguinal hernia. IMPRESSION: 1. Intrahepatic and extrahepatic biliary dilatation with stent within the common bile duct. This study is not able to demonstrate whether the stent is patent. Further evaluation with ERCP, MRI with biliary specific contrast agent or cholangiography may be helpful. 2. Multiple pleural plaques, minimally changed compared to 12/07/2010, likely indicating prior asbestos exposure. 3. Small, fat and fluid containing right inguinal hernia. 4. Aortic atherosclerosis. Electronically Signed: By: Deatra RobinsonKevin  Herman M.D. On: 03/26/2016 05:15   Dg Abdomen Acute W/chest  Result Date: 03/25/2016 CLINICAL DATA:  Generalized abdominal pain and vomiting, onset this evening. EXAM: DG ABDOMEN ACUTE W/ 1V CHEST COMPARISON:  12/26/2012 FINDINGS: Bile duct stent. Cholecystectomy clips. The abdominal gas pattern is negative for obstruction or perforation. No biliary or urinary calculi are evident. Implanted stimulator superimposes the right abdomen. The upright view of the chest demonstrates stable mild lung base opacities, possibly scarring or atelectasis. No consolidation. No large effusions. IMPRESSION: No radiographic evidence of bowel obstruction or perforation. No acute findings in the chest. Electronically Signed   By: Ellery Plunkaniel R Mitchell M.D.   On: 03/25/2016 23:55     Scheduled Meds: . ampicillin-sulbactam (UNASYN) IV  3 g Intravenous Q8H  . aspirin EC  81 mg Oral q morning - 10a  . diltiazem  240 mg Oral q morning - 10a  . heparin  5,000 Units Subcutaneous Q8H  . LORazepam  1 mg Oral Once  . ondansetron (ZOFRAN) IV  4 mg Intravenous Once  . pantoprazole  40 mg Oral Daily  . rOPINIRole  1 mg Oral QHS  . rosuvastatin  5 mg Oral q morning - 10a   Continuous Infusions: . dextrose 5 % and  0.45% NaCl 1,000 mL (03/27/16 1045)     LOS: 1 day    Latrelle DodrillEdwin Silva, Horn [facility] Pager [phone]  If 7PM-7AM, please contact night-coverage [email or web address] Password [email or web address] 03/27/2016, 3:07 PM

## 2016-03-27 NOTE — Progress Notes (Signed)
Patient more alert in the afternoon, able to communicate his needs. His son at bedside. Will continue to monitor.

## 2016-03-28 ENCOUNTER — Inpatient Hospital Stay (HOSPITAL_COMMUNITY): Payer: Medicare Other | Admitting: Certified Registered Nurse Anesthetist

## 2016-03-28 ENCOUNTER — Encounter (HOSPITAL_COMMUNITY): Payer: Self-pay | Admitting: Certified Registered Nurse Anesthetist

## 2016-03-28 ENCOUNTER — Encounter (HOSPITAL_COMMUNITY)
Admission: EM | Disposition: A | Discharge status: Home / Self Care | Payer: Self-pay | Source: Home / Self Care | Attending: Family Medicine

## 2016-03-28 ENCOUNTER — Inpatient Hospital Stay (HOSPITAL_COMMUNITY): Payer: Medicare Other

## 2016-03-28 DIAGNOSIS — Z4659 Encounter for fitting and adjustment of other gastrointestinal appliance and device: Secondary | ICD-10-CM

## 2016-03-28 DIAGNOSIS — R932 Abnormal findings on diagnostic imaging of liver and biliary tract: Secondary | ICD-10-CM

## 2016-03-28 DIAGNOSIS — I471 Supraventricular tachycardia: Secondary | ICD-10-CM

## 2016-03-28 DIAGNOSIS — K831 Obstruction of bile duct: Secondary | ICD-10-CM

## 2016-03-28 DIAGNOSIS — K8031 Calculus of bile duct with cholangitis, unspecified, with obstruction: Principal | ICD-10-CM

## 2016-03-28 DIAGNOSIS — R17 Unspecified jaundice: Secondary | ICD-10-CM

## 2016-03-28 HISTORY — PX: ERCP: SHX5425

## 2016-03-28 LAB — COMPREHENSIVE METABOLIC PANEL
ALK PHOS: 133 U/L — AB (ref 38–126)
ALT: 139 U/L — AB (ref 17–63)
AST: 93 U/L — ABNORMAL HIGH (ref 15–41)
Albumin: 2.7 g/dL — ABNORMAL LOW (ref 3.5–5.0)
Anion gap: 7 (ref 5–15)
BUN: 12 mg/dL (ref 6–20)
CALCIUM: 8.8 mg/dL — AB (ref 8.9–10.3)
CHLORIDE: 105 mmol/L (ref 101–111)
CO2: 27 mmol/L (ref 22–32)
CREATININE: 1.07 mg/dL (ref 0.61–1.24)
Glucose, Bld: 103 mg/dL — ABNORMAL HIGH (ref 65–99)
Potassium: 3.6 mmol/L (ref 3.5–5.1)
Sodium: 139 mmol/L (ref 135–145)
Total Bilirubin: 2.5 mg/dL — ABNORMAL HIGH (ref 0.3–1.2)
Total Protein: 5.6 g/dL — ABNORMAL LOW (ref 6.5–8.1)

## 2016-03-28 LAB — CBC
HCT: 35.5 % — ABNORMAL LOW (ref 39.0–52.0)
Hemoglobin: 11.8 g/dL — ABNORMAL LOW (ref 13.0–17.0)
MCH: 28.7 pg (ref 26.0–34.0)
MCHC: 33.2 g/dL (ref 30.0–36.0)
MCV: 86.4 fL (ref 78.0–100.0)
Platelets: 116 10*3/uL — ABNORMAL LOW (ref 150–400)
RBC: 4.11 MIL/uL — AB (ref 4.22–5.81)
RDW: 14.7 % (ref 11.5–15.5)
WBC: 11.6 10*3/uL — AB (ref 4.0–10.5)

## 2016-03-28 LAB — PROTIME-INR
INR: 1.11
PROTHROMBIN TIME: 14.3 s (ref 11.4–15.2)

## 2016-03-28 SURGERY — ERCP, WITH INTERVENTION IF INDICATED
Anesthesia: General

## 2016-03-28 MED ORDER — INDOMETHACIN 50 MG RE SUPP
RECTAL | Status: AC
  Filled 2016-03-28: qty 2

## 2016-03-28 MED ORDER — IOPAMIDOL (ISOVUE-300) INJECTION 61%
INTRAVENOUS | Status: AC
  Filled 2016-03-28: qty 50

## 2016-03-28 MED ORDER — GLUCAGON HCL RDNA (DIAGNOSTIC) 1 MG IJ SOLR
INTRAMUSCULAR | Status: AC
  Filled 2016-03-28: qty 2

## 2016-03-28 MED ORDER — INDOMETHACIN 50 MG RE SUPP
100.0000 mg | Freq: Once | RECTAL | Status: AC
  Administered 2016-03-28: 100 mg via RECTAL

## 2016-03-28 MED ORDER — GLUCAGON HCL RDNA (DIAGNOSTIC) 1 MG IJ SOLR
INTRAMUSCULAR | Status: DC | PRN
  Administered 2016-03-28 (×2): .5 mg via INTRAVENOUS

## 2016-03-28 MED ORDER — PHENYLEPHRINE HCL 10 MG/ML IJ SOLN
INTRAVENOUS | Status: DC | PRN
  Administered 2016-03-28: 50 ug/min via INTRAVENOUS

## 2016-03-28 MED ORDER — SODIUM CHLORIDE 0.9 % IV SOLN
INTRAVENOUS | Status: DC | PRN
  Administered 2016-03-28: 3 g via INTRAVENOUS

## 2016-03-28 MED ORDER — SUGAMMADEX SODIUM 200 MG/2ML IV SOLN
INTRAVENOUS | Status: DC | PRN
  Administered 2016-03-28: 200 mg via INTRAVENOUS

## 2016-03-28 MED ORDER — SODIUM CHLORIDE 0.9 % IV SOLN: INTRAVENOUS | Status: DC

## 2016-03-28 MED ORDER — FENTANYL CITRATE (PF) 100 MCG/2ML IJ SOLN: 25.0000 ug | INTRAMUSCULAR | Status: DC | PRN

## 2016-03-28 MED ORDER — ROCURONIUM BROMIDE 100 MG/10ML IV SOLN
INTRAVENOUS | Status: DC | PRN
  Administered 2016-03-28: 40 mg via INTRAVENOUS

## 2016-03-28 MED ORDER — LACTATED RINGERS IV SOLN
INTRAVENOUS | Status: DC | PRN
  Administered 2016-03-28: 09:00:00 via INTRAVENOUS

## 2016-03-28 MED ORDER — ONDANSETRON HCL 4 MG/2ML IJ SOLN
INTRAMUSCULAR | Status: DC | PRN
  Administered 2016-03-28: 4 mg via INTRAVENOUS

## 2016-03-28 MED ORDER — PROPOFOL 10 MG/ML IV BOLUS
INTRAVENOUS | Status: DC | PRN
  Administered 2016-03-28: 130 mg via INTRAVENOUS

## 2016-03-28 MED ORDER — PHENYLEPHRINE HCL 10 MG/ML IJ SOLN
INTRAMUSCULAR | Status: DC | PRN
  Administered 2016-03-28 (×3): 80 ug via INTRAVENOUS

## 2016-03-28 MED ORDER — FENTANYL CITRATE (PF) 100 MCG/2ML IJ SOLN
INTRAMUSCULAR | Status: DC | PRN
  Administered 2016-03-28: 50 ug via INTRAVENOUS

## 2016-03-28 MED ORDER — ONDANSETRON HCL 4 MG/2ML IJ SOLN: 4.0000 mg | Freq: Once | INTRAMUSCULAR | Status: DC | PRN

## 2016-03-28 MED ORDER — LACTATED RINGERS IV SOLN: INTRAVENOUS | Status: DC

## 2016-03-28 MED ORDER — SODIUM CHLORIDE 0.9 % IV SOLN
INTRAVENOUS | Status: DC | PRN
  Administered 2016-03-28: 50 mL

## 2016-03-28 MED ORDER — LIDOCAINE HCL (CARDIAC) 20 MG/ML IV SOLN
INTRAVENOUS | Status: DC | PRN
  Administered 2016-03-28: 40 mg via INTRAVENOUS

## 2016-03-28 NOTE — Anesthesia Procedure Notes (Signed)
Procedure Name: Intubation Date/Time: 03/28/2016 9:08 AM Performed by: Dairl PonderJIANG, Teryn Gust Pre-anesthesia Checklist: Patient identified, Emergency Drugs available, Suction available, Patient being monitored and Timeout performed Patient Re-evaluated:Patient Re-evaluated prior to inductionOxygen Delivery Method: Circle system utilized Preoxygenation: Pre-oxygenation with 100% oxygen Intubation Type: IV induction Ventilation: Mask ventilation without difficulty and Oral airway inserted - appropriate to patient size Laryngoscope Size: Mac and 3 Grade View: Grade I Tube type: Oral Tube size: 7.5 mm Number of attempts: 1 Airway Equipment and Method: Stylet Placement Confirmation: ETT inserted through vocal cords under direct vision,  positive ETCO2 and breath sounds checked- equal and bilateral Secured at: 22 cm Tube secured with: Tape Dental Injury: Teeth and Oropharynx as per pre-operative assessment

## 2016-03-28 NOTE — H&P (View-Only) (Signed)
Reason for Consult: Clogged biliary stent Referring Physician: [facility]  [name] HPI: This is a 80 year old male with a PMH of a distal biliary stricture, history of cholangitis, GERD, HTN, lateral sclerosis and CAD admitted for worsening abdominal pain.  Upon further evaluation in the ER he was identified to have an elevation his liver enzymes.  He has a history of biliary stricture and he was first evaluated by Dr. Jena Gaussourk on 12/26/2015 with an ERCP.  The CBD was diffusely dilated and balloon sweep of the CBD was significant for sludge.  There was diffuse dilation of the CBD and a smooth stricturing in the distal CBD.  A stent was placed after the sphincterotomy and balloon sweep.  He was referred to Dr. Christella HartiganJacobs and on 01/30/2013 he underwent an EUS and repeat ERCP with stent placement.  Brushings of the distal CBD were negative for malignancy.  Dr. Christella HartiganJacobs replaced his stent and recommended Spyglass for further work up.  Per the records, Dr. Jena Gaussourk attempted to contact the patient on several occassions [facility] Spy[facility], but he was not able to get in touch.  The [facility].  From the records, it does not appear that any stent change was performed since Dr. Larae GroomsJacob's performed the ERCP.  Past Medical History:  Diagnosis Date  . Anemia   . Common biliary duct stricture    Refer to past surgical history. [facility], Dr. Corliss Parishodd Baron, referred for direct cholangioscopy. Stent remains in since 01/2013.  Marland Kitchen. Coronary artery disease    taxus stent to the right coronary artery 2005 low risk cardiolite 2008  . GERD (gastroesophageal reflux disease)   . Hypertension   . Primary lateral sclerosis    [facility]  . Spondylolisthesis at L5-S1 level    fusion 2010  . SVT (supraventricular tachycardia) (HCC)    documented with a rate of 170 BPM 11/09 adenosine treatment converted to regular rhythm  patient placed on diltiazem  . Syncope    in the past thought possibly vasovagal. no palpitations at this time    Past Surgical History:  Procedure Laterality Date  . ANGIOPLASTY / STENTING FEMORAL  2005  . BACLOFEN PUMP REFILL  02/2010  . BILIARY STENT PLACEMENT N/A 12/25/2012   Procedure: BILIARY STENT PLACEMENT;  Surgeon: Corbin Adeobert M Rourk, MD;  Location: [facility];  Service: Endoscopy;  Laterality: N/A;  . CAROTID ENDARTERECTOMY    . CAROTID ENDARTERECTOMY    . CHOLECYSTECTOMY    . ENDOSCOPIC RETROGRADE CHOLANGIOPANCREATOGRAPHY (ERCP) WITH PROPOFOL N/A 01/30/2013   Dr. Christella HartiganJacobs: 2 mm long fixed distal common bile duct stricture located 5 mm proximal to a biliary orifice. Bowel duct brushings were benign. The patient was restented with a plastic biliary stent.  Marland Kitchen. ERCP N/A 12/25/2012   ZOX:[id number]:Diffusely dilated biliary tree with focal narrowing of the distal common bile duct of uncertain significance/Status post biliary sphincterotomy and balloon dredging with recovery of sludge material as described above/Status post placement of a 10-French, 5 cm biliary stent  . EUS N/A 01/30/2013   Dr. Christella HartiganJacobs: Walls of extrahepatic bile duct thick at 2 mm diffusely, more than usual hypoechoic soft tissue at the distal common bile duct, questionable him representing mass versus prominent ampulla versus reactive from stent. Stent removed and sent to cytology, I do not see any results for this.  Marland Kitchen. REMOVAL OF STONES N/A 12/25/2012  Procedure: REMOVAL OF STONES;  Surgeon: Corbin Ade, MD;  Location: [facility];  Service: Endoscopy;  Laterality: N/A;  . SPHINCTEROTOMY N/A 12/25/2012   Procedure: SPHINCTEROTOMY;  Surgeon: Corbin Ade, MD;  Location: [facility];  Service: Endoscopy;  Laterality: N/A;    Family History  Problem Relation Age of Onset  . Colon cancer Neg Hx     Social History:  reports that he quit smoking about 18 years ago. His smoking use included Cigarettes. He has a 67.50 pack-year smoking history. He  has never used smokeless tobacco. He reports that he does not drink alcohol or use drugs.  Allergies: No Known Allergies  Medications:  Scheduled: . ampicillin-sulbactam (UNASYN) IV  3 g Intravenous Q8H  . [START ON 03/27/2016] aspirin EC  81 mg Oral q morning - 10a  . [START ON 03/27/2016] diltiazem  240 mg Oral q morning - 10a  . heparin  5,000 Units Subcutaneous Q8H  . LORazepam  1 mg Oral Once  . ondansetron (ZOFRAN) IV  4 mg Intravenous Once  . pantoprazole  40 mg Oral Daily  . rOPINIRole  1 mg Oral QHS  . [START ON 03/27/2016] rosuvastatin  5 mg Oral q morning - 10a   Continuous: . dextrose 5 % and 0.45% NaCl 75 mL/hr at 03/26/16 1625    Results for orders placed or performed during the hospital encounter of 03/25/16 (from the past 24 hour(s))  Lipase, blood     Status: None   Collection Time: 03/25/16 10:44 PM  Result Value Ref Range   Lipase 20 11 - 51 U/L  Comprehensive metabolic panel     Status: Abnormal   Collection Time: 03/25/16 10:44 PM  Result Value Ref Range   Sodium 143 135 - 145 mmol/L   Potassium 4.0 3.5 - 5.1 mmol/L   Chloride 101 101 - 111 mmol/L   CO2 26 22 - 32 mmol/L   Glucose, Bld 182 (H) 65 - 99 mg/dL   BUN 17 6 - 20 mg/dL   Creatinine, Ser 4.54 (H) 0.61 - 1.24 mg/dL   Calcium 09.8 8.9 - 11.9 mg/dL   Total Protein 8.0 6.5 - 8.1 g/dL   Albumin 4.4 3.5 - 5.0 g/dL   AST 147 (H) 15 - 41 U/L   ALT 159 (H) 17 - 63 U/L   Alkaline Phosphatase 200 (H) 38 - 126 U/L   Total Bilirubin 1.1 0.3 - 1.2 mg/dL   GFR calc non Af Amer 44 (L) >60 mL/min   GFR calc Af Amer 51 (L) >60 mL/min   Anion gap 16 (H) 5 - 15  CBC     Status: None   Collection Time: 03/25/16 10:44 PM  Result Value Ref Range   WBC 6.3 4.0 - 10.5 K/uL   RBC 4.95 4.22 - 5.81 MIL/uL   Hemoglobin 14.4 13.0 - 17.0 g/dL   HCT 82.9 56.2 - 13.0 %   MCV 89.9 78.0 - 100.0 fL   MCH 29.1 26.0 - 34.0 pg   MCHC 32.4 30.0 - 36.0 g/dL   RDW 86.5 78.4 - 69.6 %   Platelets 233 150 - 400 K/uL   Urinalysis, Routine w reflex microscopic     Status: Abnormal   Collection Time: 03/26/16  2:20 AM  Result Value Ref Range   Color, Urine AMBER (A) YELLOW   APPearance CLEAR CLEAR   Specific Gravity, Urine 1.013 1.005 - 1.030   pH 6.0 5.0 - 8.0   Glucose, UA NEGATIVE  NEGATIVE mg/dL   Hgb urine dipstick NEGATIVE NEGATIVE   Bilirubin Urine NEGATIVE NEGATIVE   Ketones, ur NEGATIVE NEGATIVE mg/dL   Protein, ur NEGATIVE NEGATIVE mg/dL   Nitrite NEGATIVE NEGATIVE   Leukocytes, UA NEGATIVE NEGATIVE  CBC     Status: Abnormal   Collection Time: 03/26/16  6:11 PM  Result Value Ref Range   WBC 19.5 (H) 4.0 - 10.5 K/uL   RBC 4.29 4.22 - 5.81 MIL/uL   Hemoglobin 12.6 (L) 13.0 - 17.0 g/dL   HCT 16.137.2 (L) 09.639.0 - 04.552.0 %   MCV 86.7 78.0 - 100.0 fL   MCH 29.4 26.0 - 34.0 pg   MCHC 33.9 30.0 - 36.0 g/dL   RDW 40.914.7 81.111.5 - 91.415.5 %   Platelets 164 150 - 400 K/uL  Creatinine, serum     Status: Abnormal   Collection Time: 03/26/16  6:11 PM  Result Value Ref Range   Creatinine, Ser 1.36 (H) 0.61 - 1.24 mg/dL   GFR calc non Af Amer 48 (L) >60 mL/min   GFR calc Af Amer 56 (L) >60 mL/min     Ct Abdomen Pelvis W Contrast  Addendum Date: 03/26/2016   ADDENDUM REPORT: 03/26/2016 10:37 ADDENDUM: Review requested to assess for utility of MRI given the metallic device placement along the right mid abdominal wall, which would lead to susceptibility artifact across portions of the liver and common bile duct. On additional review, there is a 1.4 x 2.4 cm spiculated soft tissue lesion adjacent to the distal CBD (series 2/image 42). There is associated wall thickening/enhancement of the distal CBD (series 2/image 41). There are four small upper abdominal nodes in the periportal region measuring 10-11 mm short axis (series 2/images 33, 35, and 37). This overall appearance is highly concerning for extrahepatic cholangiocarcinoma, less likely pancreatic cancer. In this context, MRI is unlikely to provide additional  utility. Even if the lesion was not obscured by susceptibility artifact, the MRI would not obviate the need for potential tissue diagnosis. Consider ERCP/EUS for further evaluation and tissue diagnosis, as clinically warranted given the patient's additional comorbidities. Electronically Signed   By: Charline BillsSriyesh  Krishnan M.D.   On: 03/26/2016 10:37   Result Date: 03/26/2016 CLINICAL DATA:  Abdominal pain EXAM: CT ABDOMEN AND PELVIS WITH CONTRAST TECHNIQUE: Multidetector CT imaging of the abdomen and pelvis was performed using the standard protocol following bolus administration of intravenous contrast. CONTRAST:  100mL ISOVUE-300 IOPAMIDOL (ISOVUE-300) INJECTION 61%, 30mL ISOVUE-300 IOPAMIDOL (ISOVUE-300) INJECTION 61% COMPARISON:  CT abdomen pelvis 12/24/2012 FINDINGS: Lower chest: There is a partially calcified plaque of the left lung base. There is noncalcified plaque of the anterior right pleura.r there is a small fluid collection adjacent to the esophagus (series 2, image 17), measuring 1.5 x 2.0 cm. Hepatobiliary: There is intrahepatic biliary dilatation. The common bile duct is also dilated. There is a stent within the common bile duct, terminating in the duodenum. The gallbladder is surgically absent. Pancreas: There is senescent fatty atrophy of the pancreas. Spleen: Normal. Adrenals/Urinary Tract: Normal adrenal glands. No hydronephrosis or solid renal mass. Stomach/Bowel: There is sigmoid diverticulosis without acute inflammation. No abdominal fluid collection. Normal appendix. Vascular/Lymphatic: There is atherosclerotic calcification of the non aneurysmal abdominal aorta. No abdominal or pelvic adenopathy. Reproductive: Normal prostate and seminal vesicles. Musculoskeletal: There is lumbosacral fusion hardware with associated disc spacer material. There is mild subsidence at the inferior endplate of L5. There is grade 1 anterolisthesis at this level. There is a spinal stimulator device with its  battery  pack in the subcutaneous tissues of the right lower quadrant. Unchanged T12 compression fracture. Other: There is fluid in the right inguinal canal, within a fat containing inguinal hernia. IMPRESSION: 1. Intrahepatic and extrahepatic biliary dilatation with stent within the common bile duct. This study is not able to demonstrate whether the stent is patent. Further evaluation with ERCP, MRI with biliary specific contrast agent or cholangiography may be helpful. 2. Multiple pleural plaques, minimally changed compared to 12/07/2010, likely indicating prior asbestos exposure. 3. Small, fat and fluid containing right inguinal hernia. 4. Aortic atherosclerosis. Electronically Signed: By: Deatra Robinson M.D. On: 03/26/2016 05:15   Dg Abdomen Acute W/chest  Result Date: 03/25/2016 CLINICAL DATA:  Generalized abdominal pain and vomiting, onset this evening. EXAM: DG ABDOMEN ACUTE W/ 1V CHEST COMPARISON:  12/26/2012 FINDINGS: Bile duct stent. Cholecystectomy clips. The abdominal gas pattern is negative for obstruction or perforation. No biliary or urinary calculi are evident. Implanted stimulator superimposes the right abdomen. The upright view of the chest demonstrates stable mild lung base opacities, possibly scarring or atelectasis. No consolidation. No large effusions. IMPRESSION: No radiographic evidence of bowel obstruction or perforation. No acute findings in the chest. Electronically Signed   By: Ellery Plunk M.D.   On: 03/25/2016 23:55    ROS:  As stated above in the HPI otherwise negative.  Blood pressure 97/60, pulse (!) 101, temperature 99 F (37.2 C), temperature source Oral, resp. rate (!) 22, height 5\' 6"  (1.676 m), weight 74.5 kg (164 lb 4.8 oz), SpO2 100 %.    PE: Gen: NAD, Alert and Oriented HEENT:  [place]/AT, EOMI Neck: Supple, no LAD Lungs: CTA Bilaterally CV: RRR without M/G/R ABM: Soft, mild epigastric tenderness, +BS Ext: No C/C/E  Assessment/Plan: 1) Obstructed biliary  stent. 2) Abnormal liver enzymes. 3) Primary lateral sclerosis.   When he firs [facility] to [facility] his symptoms resolved, but the recommendation was for him to undergo further treatment with a stent exchange.  His WBC was normal and he was not febrile.  Since the time of transfer his WBC increased to 18,000, but no fever.  Unasyn was started.  His WBC dropped down to 16.  He has some mild epigastric tenderness.  Plan: 1) ERCP with stent replacement on Tuesday unless he acutely worsens. 2) Continue with Unasyn.  Ahmiya Abee D 03/26/2016, 9:34 PM

## 2016-03-28 NOTE — Anesthesia Preprocedure Evaluation (Addendum)
Anesthesia Evaluation  Patient identified by MRN, date of birth, ID band Patient awake    Reviewed: Allergy & Precautions, NPO status , Patient's Chart, lab work & pertinent test results  Airway Mallampati: II  TM Distance: >3 FB Neck ROM: Full    Dental  (+) Edentulous Upper, Edentulous Lower   Pulmonary former smoker,     + decreased breath sounds      Cardiovascular hypertension,  Rhythm:Regular Rate:Normal     Neuro/Psych    GI/Hepatic   Endo/Other    Renal/GU      Musculoskeletal   Abdominal   Peds  Hematology   Anesthesia Other Findings   Reproductive/Obstetrics                            Anesthesia Physical Anesthesia Plan  ASA: III  Anesthesia Plan: General   Post-op Pain Management:    Induction: Intravenous  Airway Management Planned: Oral ETT  Additional Equipment:   Intra-op Plan:   Post-operative Plan:   Informed Consent: I have reviewed the patients History and Physical, chart, labs and discussed the procedure including the risks, benefits and alternatives for the proposed anesthesia with the patient or authorized representative who has indicated his/her understanding and acceptance.     Plan Discussed with:   Anesthesia Plan Comments:         Anesthesia Quick Evaluation

## 2016-03-28 NOTE — Progress Notes (Signed)
Pharmacy Antibiotic Note  [name] is a 80 y.o. male admitted on 03/25/2016 with intra-abdominal infection.  Pharmacy has been consulted for Unasyn dosing.  Day #3 of abx for intra-abdominal infection. Obstructed biliary stent, needs stent exchange scheduled for 12/26. 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 Afebrile, WBC down to 11.6.   Plan: Continue Unasyn 3g IV Q8 Monitor clinical picture, renal function F/U C&S, abx deescalation / LOT  Height: 5\' 6"  (167.6 cm) Weight: 164 lb 4.8 oz (74.5 kg) IBW/kg (Calculated) : 63.8  Temp (24hrs), Avg:98.3 F (36.8 C), Min:97.8 F (36.6 C), Max:99 F (37.2 C)   Recent Labs Lab 03/25/16 2244 03/26/16 1811 03/27/16 0440 03/28/16 0743  WBC 6.3 19.5* 16.0* 11.6*  CREATININE 1.47* 1.36* 1.29* 1.07    Estimated Creatinine Clearance: 51.3 mL/min (by C-G formula based on SCr of 1.07 mg/dL).    No Known Allergies  Antimicrobials this admission: Unasyn 12/24 >>   Dose adjustments this admission: n/a  Microbiology results: No cultures  Thank you for allowing pharmacy to be a part of this patient's care.  [name], PharmD, BCPS Clinical Pharmacist Pager [phone] 03/28/2016 1:58 PM

## 2016-03-28 NOTE — Anesthesia Postprocedure Evaluation (Signed)
Anesthesia Post Note  Patient: [name]  Procedure(s) Performed: Procedure(s) (LRB): ENDOSCOPIC RETROGRADE CHOLANGIOPANCREATOGRAPHY (ERCP) (N/A)  Patient location during evaluation: PACU Anesthesia Type: General Level of consciousness: awake, awake and alert, oriented and sedated Pain management: pain level controlled Vital Signs Assessment: post-procedure vital signs reviewed and stable Cardiovascular status: blood pressure returned to baseline Anesthetic complications: no       Last Vitals:  Vitals:   03/28/16 1054 03/28/16 1057  BP:  (!) 135/55  Pulse:    Resp:    Temp: 36.7 C     Last Pain:  Vitals:   03/28/16 0835  TempSrc: Oral  PainSc:                  Tola Meas COKER

## 2016-03-28 NOTE — Interval H&P Note (Signed)
History and Physical Interval Note:  03/28/2016 8:48 AM  [name]  has presented today for surgery, with the diagnosis of Biliary obstruction  The various methods of treatment have been discussed with the patient and family. After consideration of risks, benefits and other options for treatment, the patient has consented to  Procedure(s): ENDOSCOPIC RETROGRADE CHOLANGIOPANCREATOGRAPHY (ERCP) (N/A) as a surgical intervention .  The patient's history has been reviewed, patient examined, no change in status, stable for surgery.  I have reviewed the patient's chart and labs.  Questions were answered to the patient's satisfaction.    Consent obtained from son due to patient's hearing impairment.  Patient also verbally acknowledges understanding. STAT PT/INR obtained   [name]

## 2016-03-28 NOTE — Transfer of Care (Signed)
Immediate Anesthesia Transfer of Care Note  Patient: [name]  Procedure(s) Performed: Procedure(s): ENDOSCOPIC RETROGRADE CHOLANGIOPANCREATOGRAPHY (ERCP) (N/A)  Patient Location: Endoscopy Unit  Anesthesia Type:General  Level of Consciousness: awake, alert  and oriented  Airway & Oxygen Therapy: Patient Spontanous Breathing and Patient connected to nasal cannula oxygen  Post-op Assessment: Report given to RN and Post -op Vital signs reviewed and stable  Post vital signs: Reviewed and stable  Last Vitals:  Vitals:   03/28/16 0838 03/28/16 1029  BP: (!) 118/91   Pulse:    Resp:    Temp:  36.9 C    Last Pain:  Vitals:   03/28/16 0835  TempSrc: Oral  PainSc:       Patients Stated Pain Goal: 2 (03/27/16 1745)  Complications: No apparent anesthesia complications

## 2016-03-28 NOTE — Progress Notes (Signed)
   03/28/16 0800  PT Visit Information  Last PT Received On 03/28/16  Reason Eval/Treat Not Completed Patient at procedure or test/unavailable  Will check on her later as time and pt allow.  Samul Dadauth Illa Enlow, PT MS Acute Rehab Dept. Number: [facility] [id number] and [facility] [phone]

## 2016-03-28 NOTE — Op Note (Signed)
[facility] Patient Name: Lucas LappingGene Horn Procedure Date : 03/28/2016 MRN: [id number] Attending MD: Starr LakeHenry L. Myrtie Neitheranis , MD Date of Birth: 1936-03-10 CSN: [id number] Age: 80 Admit Type: Inpatient Procedure:                ERCP Indications:              Jaundice, Biliary stent removal Providers:                Sherilyn CooterHenry L. Myrtie Neitheranis, MD, Will BonnetKatie Winchester RN, RN, Harrington ChallengerHope                            Parker, Technician Referring MD:              Medicines:                General Anesthesia, Unasyn 3 g IV Complications:            No immediate complications. Estimated Blood Loss:     Estimated blood loss was minimal. Procedure:                Pre-Anesthesia Assessment:                           - Prior to the procedure, a History and Physical                            was performed, and patient medications and                            allergies were reviewed. The patient's tolerance of                            previous anesthesia was also reviewed. The risks                            and benefits of the procedure and the sedation                            options and risks were discussed with the patient.                            All questions were answered, and informed consent                            was obtained. Anticoagulants: The patient has taken                            aspirin. It was decided not to withhold this                            medication prior to the procedure. ASA Grade                            Assessment: III - A patient with severe systemic  disease. After reviewing the risks and benefits,                            the patient was deemed in satisfactory condition to                            undergo the procedure.                           After obtaining informed consent, the scope was                            passed under direct vision. Throughout the                            procedure, the patient's blood pressure,  pulse, and                            oxygen saturations were monitored continuously. The                            ZO-[id number] ([id number]) scope was introduced through                            the mouth, and used to inject contrast into and                            used to inject contrast into the bile duct. The                            ERCP was accomplished without difficulty. The                            patient tolerated the procedure well. Scope In: Scope Out: Findings:      A scout film of the abdomen was obtained. Surgical clips, consistent       with a previous cholecystectomy, were seen in the area of the right       upper quadrant of the abdomen. The previously-placed stent was not seen       on fluoroscopy and was no seen on endoscopy. Therefore, it had       previously fallen out. The esophagus was successfully intubated under       direct vision. The scope was advanced to a normal major papilla in the       descending duodenum without detailed examination of the pharynx, larynx       and associated structures, and upper GI tract. The upper GI tract was       grossly normal. The bile duct was deeply cannulated with the traction       (standard) sphincterotome. Contrast was injected. I personally       interpreted the bile duct images. There was brisk flow of contrast       through the ducts. Image quality was excellent. Contrast extended to the       hepatic ducts. The main bile duct was diffusely dilated. The largest  diameter was 12 mm. The lower third of the main duct contained a 4mm       filling defect(s) thought to be a stone. It was difficult to obtain       optimal images of the most distal CBD since there had been a       spnincterotomy and contrast kept flowing freely from the duct. 0.035       inch x 260 cm straight Hydra Jagwire was passed into the biliary tree.       The biliary tree was swept four times with a 12 mm balloon starting at       the  bifurcation. Stone debris and scant pus was swept from the duct on       the first pass, and nothing was removed on subsequent passes. An       occlusion cholangiogram was obtained to confirm ductal clearance. The       ampulla had had a previous sphincterotomy, and the tissue was abnormal       in appearance - frond-like (see photo). This was biopsied with a cold       forceps for histology. Cells for cytology were obtained by brushing the       distal CBD. One 10 Fr by 5 cm plastic stent with a single external flap       and a single internal flap was placed 4 cm into the common bile duct.       Bile flowed through the stent. The stent was in good position. Impression:               - A filling defect consistent with a stone was seen                            on the cholangiogram.                           - The entire main bile duct was dilated.                           - The examination was suspicious for                            choledocholithiasis. Complete removal was                            accomplished by sweeping.                           - The biliary tree was swept and debris and pus                            were found.                           - One plastic stent was placed into the common bile                            duct. Recommendation:           - Await cytology results and await path results. Procedure Code(s):        ---  Professional ---                           [id number], Endoscopic retrograde                            cholangiopancreatography (ERCP); with placement of                            endoscopic stent into biliary or pancreatic duct,                            including pre- and post-dilation and guide wire                            passage, when performed, including sphincterotomy,                            when performed, each stent                           43264, Endoscopic retrograde                            cholangiopancreatography  (ERCP); with removal of                            calculi/debris from biliary/pancreatic duct(s)                           43261, Endoscopic retrograde                            cholangiopancreatography (ERCP); with biopsy,                            single or multiple Diagnosis Code(s):        --- Professional ---                           R17, Unspecified jaundice                           Z46.59, Encounter for fitting and adjustment of                            other gastrointestinal appliance and device                           K83.8, Other specified diseases of biliary tract                           R93.2, Abnormal findings on diagnostic imaging of                            liver and biliary tract CPT copyright 2016 American Medical Association. All rights reserved. The codes documented in this report are preliminary and upon coder  review may  be revised to meet current compliance requirements. Garnell Begeman L. Myrtie Neither, MD 03/28/2016 10:32:28 AM This report has been signed electronically. Number of Addenda: 0

## 2016-03-28 NOTE — Progress Notes (Signed)
PROGRESS NOTE [facility]   Jarvis NewcomerGene R Mcgrail   WUJ:[id number] DOB: 1936-02-09  DOA: 03/25/2016 PCP: Josue HectorNYLAND,LEONARD ROBERT, MD   Brief Narrative: Lucas LappingGene Roscher  is a 80 y.o. 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  [facility]   Subjective: Patients seen and examined with son at bedside. S/p ERCP, have no complaints wants to go home.   Assessment & Plan: Abdominal pain 2/2 CBD stricture - patient with hx with the same in the past had CBD stent placed.  S/P ERCP with stent placement see Impressions report below - pus was fund  Initially liver enzymes elevated - now trending down  Continue empiric unasyn  Advance diet as tolerated  High risk for perioperative cardipulm complications due to hx of CAD, PLS and generalize deconditioning  PLS/Motor neuro disease - bed/wheelchair bound, has baclofen pump for muscle spasm Continue current management and supportive treatment    CAD - stable no chest pain  Continue ASA and Statin for secondary prevention   Hx of SVT  Continue tele monitor  Continue Cardizem   AKI - likely 2/2 dehydration - Resolved with IVF  Can d/c IVF  Encourage oral hydration  Monitor BMP   DVT prophylaxis: Heparin  Code Status: DNR Family Communication: Son at bedside Disposition Plan: Home when medically stable likely 24 -48 hrs Consultants:     GI  Procedures:   ERCP in AM   Antimicrobials:  Unasyn 12/25   Objective: Vitals:   03/28/16 1045 03/28/16 1054 03/28/16 1057 03/28/16 1432  BP: (!) 112/99  (!) 135/55 (!) 101/45  Pulse: 86   68  Resp: 16   15  Temp:  98 F (36.7 C)  97.8 F (36.6 C)  TempSrc:    Oral  SpO2: 96%   95%  Weight:      Height:        Intake/Output Summary (Last 24 hours) at 03/28/16 1533 Last data filed at 03/28/16 1057  Gross per 24 hour  Intake             2765 ml  Output             1200 ml  Net             1565 ml   Filed Weights   03/25/16 2238 03/26/16 1526  Weight: 73 kg (161 lb) 74.5 kg (164 lb 4.8 oz)    Examination:  General exam: NAD, mild scleral icterus  Respiratory system: CTA no wheezing  Cardiovascular system: S1 & S2 heard, RRR.  Gastrointestinal system: Abdomen soft mild tenderness in RUQ. Mild distention  Extremities: No pedal edema. Symmetric, strength 3-4/5     Data Reviewed: I have personally reviewed following labs and imaging studies  CBC:  Recent Labs Lab 03/25/16 2244 03/26/16 1811 03/27/16 0440 03/28/16 0743  WBC 6.3 19.5* 16.0* 11.6*  HGB 14.4 12.6* 11.3* 11.8*  HCT 44.5 37.2* 34.7* 35.5*  MCV 89.9 86.7 87.6 86.4  PLT 233 164 141* 116*   Basic Metabolic Panel:  Recent Labs Lab 03/25/16 2244 03/26/16 1811 03/27/16 0440 03/28/16 0743  NA 143  --  138 139  K 4.0  --  4.1 3.6  CL 101  --  105 105  CO2 26  --  25 27  GLUCOSE 182*  --  89 103*  BUN 17  --  22* 12  CREATININE 1.47* 1.36* 1.29* 1.07  CALCIUM 10.0  --  8.4* 8.8*   GFR: Estimated Creatinine Clearance: 51.3 mL/min (by C-G formula based on SCr of 1.07 mg/dL). Liver Function Tests:  Recent Labs Lab 03/25/16 2244 03/27/16 0440 03/28/16 0743  AST 383* 208* 93*  ALT 159* 217* 139*  ALKPHOS 200* 149* 133*  BILITOT 1.1 3.3* 2.5*  PROT 8.0 5.5* 5.6*  ALBUMIN 4.4 2.8* 2.7*    Recent Labs Lab 03/25/16 2244  LIPASE 20   No results for input(s): AMMONIA in the  last 168 hours. Coagulation Profile:  Recent Labs Lab 03/28/16 1224  INR 1.11   Cardiac Enzymes: No results for input(s): CKTOTAL, CKMB, CKMBINDEX, TROPONINI in the last 168 hours. BNP (last 3 results) No results for input(s): PROBNP in the last 8760 hours. HbA1C: No results for input(s): HGBA1C in the last 72 hours. CBG: No results for input(s): GLUCAP in the last 168 hours. Lipid Profile: No results for input(s): CHOL, HDL, LDLCALC, TRIG, CHOLHDL, LDLDIRECT in the last 72 hours. Thyroid Function Tests: No results for input(s): TSH, T4TOTAL, FREET4, T3FREE, THYROIDAB in the last 72 hours. Anemia Panel: No results for input(s): VITAMINB12, FOLATE, FERRITIN, TIBC, IRON, RETICCTPCT in the last 72 hours. Sepsis Labs: No results for input(s): PROCALCITON, LATICACIDVEN in the last 168 hours.  No results found for this or any previous visit (from the past 240 hour(s)).    Radiology Studies: Dg Ercp  Result Date: 03/28/2016 CLINICAL DATA:  80 year old male undergoing ERCP for bile duct stricture EXAM: ERCP TECHNIQUE: Multiple spot images obtained with the fluoroscopic device and submitted for interpretation post-procedure. FLUOROSCOPY TIME:  Fluoroscopy Time: 5 minutes 9 seconds reported please see GI operative note for further detail COMPARISON:  CT scan of the abdomen and pelvis 03/26/2016 FINDINGS: A total of 11 intraoperative spot films demonstrate a flexible endoscope in the descending duodenum with wire cannulation of the common bile duct. Contrast opacification of the biliary tree reveals irregular narrowing of the distal common bile duct. Suggestion of filling defects as well concerning for choledocholithiasis. Subsequent images demonstrate sphincterotomy and balloon sweep of the common duct. On the final image, there is been placement of a plastic biliary stent. IMPRESSION: ERCP with sphincterotomy, balloon sweep of the common bile duct and placement of a plastic biliary stent.  Irregular narrowing of the distal common bile duct prior to the above described interventions with a suggestion of filling defects. This may represent a combination of distal common bile duct stricture with sludge and/or small stones. Malignancy is difficult to exclude radiographically. Please correlate with GI operative note and biopsy results if obtained. These images were submitted for radiologic interpretation only. Please see the procedural report for the amount of contrast and the fluoroscopy time utilized. Electronically Signed   By: Malachy Moan M.D.   On: 03/28/2016 10:35     Scheduled Meds: . ampicillin-sulbactam (UNASYN) IV  3 g Intravenous Q8H  . aspirin EC  81 mg Oral q morning - 10a  . diltiazem  240 mg Oral q morning -  10a  . heparin  5,000 Units Subcutaneous Q8H  . LORazepam  1 mg Oral Once  . ondansetron (ZOFRAN) IV  4 mg Intravenous Once  . pantoprazole  40 mg Oral Daily  . rOPINIRole  1 mg Oral QHS  . rosuvastatin  5 mg Oral q morning - 10a   Continuous Infusions:    LOS: 2 days    Latrelle DodrillEdwin Silva, MD [facility] Pager [phone]  If 7PM-7AM, please contact night-coverage [email or web address] Password [email or web address] 03/28/2016, 3:33 PM

## 2016-03-29 ENCOUNTER — Encounter (HOSPITAL_COMMUNITY): Payer: Self-pay | Admitting: Gastroenterology

## 2016-03-29 DIAGNOSIS — K8031 Calculus of bile duct with cholangitis, unspecified, with obstruction: Secondary | ICD-10-CM

## 2016-03-29 DIAGNOSIS — Z9889 Other specified postprocedural states: Secondary | ICD-10-CM

## 2016-03-29 LAB — CBC
HEMATOCRIT: 33.9 % — AB (ref 39.0–52.0)
Hemoglobin: 11.4 g/dL — ABNORMAL LOW (ref 13.0–17.0)
MCH: 28.7 pg (ref 26.0–34.0)
MCHC: 33.6 g/dL (ref 30.0–36.0)
MCV: 85.4 fL (ref 78.0–100.0)
PLATELETS: 123 10*3/uL — AB (ref 150–400)
RBC: 3.97 MIL/uL — ABNORMAL LOW (ref 4.22–5.81)
RDW: 14.5 % (ref 11.5–15.5)
WBC: 8.6 10*3/uL (ref 4.0–10.5)

## 2016-03-29 LAB — HEPATIC FUNCTION PANEL
ALK PHOS: 155 U/L — AB (ref 38–126)
ALT: 106 U/L — AB (ref 17–63)
AST: 60 U/L — ABNORMAL HIGH (ref 15–41)
Albumin: 2.6 g/dL — ABNORMAL LOW (ref 3.5–5.0)
BILIRUBIN INDIRECT: 0.9 mg/dL (ref 0.3–0.9)
BILIRUBIN TOTAL: 2.1 mg/dL — AB (ref 0.3–1.2)
Bilirubin, Direct: 1.2 mg/dL — ABNORMAL HIGH (ref 0.1–0.5)
TOTAL PROTEIN: 5.5 g/dL — AB (ref 6.5–8.1)

## 2016-03-29 LAB — BASIC METABOLIC PANEL
Anion gap: 6 (ref 5–15)
BUN: 9 mg/dL (ref 6–20)
CHLORIDE: 106 mmol/L (ref 101–111)
CO2: 29 mmol/L (ref 22–32)
CREATININE: 0.94 mg/dL (ref 0.61–1.24)
Calcium: 8.5 mg/dL — ABNORMAL LOW (ref 8.9–10.3)
GFR calc Af Amer: >60 mL/min (ref >60)
GLUCOSE: 87 mg/dL (ref 65–99)
POTASSIUM: 3.6 mmol/L (ref 3.5–5.1)
SODIUM: 141 mmol/L (ref 135–145)

## 2016-03-29 MED ORDER — AMOXICILLIN-POT CLAVULANATE 875-125 MG PO TABS: 1.0000 | ORAL_TABLET | Freq: Two times a day (BID) | ORAL | 0 refills | Status: DC

## 2016-03-29 MED ORDER — AMOXICILLIN-POT CLAVULANATE 875-125 MG PO TABS: 1.0000 | ORAL_TABLET | Freq: Two times a day (BID) | ORAL | Status: DC

## 2016-03-29 NOTE — Progress Notes (Signed)
Nsg Discharge Note  Admit Date:  03/25/2016 Discharge date: 03/29/2016   [name] to be D/C'd Home per MD order.  AVS completed.  Copy for chart, and copy for patient signed, and dated. Patient/caregiver able to verbalize understanding.  Discharge Medication: Allergies as of 03/29/2016   No Known Allergies     Medication List    TAKE these medications   amoxicillin-clavulanate 875-125 MG tablet Commonly known as:  AUGMENTIN Take 1 tablet by mouth 2 (two) times daily.   aspirin EC 81 MG tablet Take 81 mg by mouth every morning.   COPPER CAPS PO Take 1 capsule by mouth 3 (three) times daily.   diltiazem 240 MG 24 hr capsule Commonly known as:  CARDIZEM CD Take 240 mg by mouth every morning.   esomeprazole 40 MG capsule Commonly known as:  NEXIUM Take 40 mg by mouth daily before breakfast.   fish oil-omega-3 fatty acids 1000 MG capsule Take 2 g by mouth daily.   lisinopril 5 MG tablet Commonly known as:  PRINIVIL,ZESTRIL Take 5 mg by mouth every evening.   meloxicam 7.5 MG tablet Commonly known as:  MOBIC Take 7.5 mg by mouth daily.   multivitamin with minerals Tabs tablet Take 0.5 tablets by mouth daily.   PARoxetine 10 MG tablet Commonly known as:  PAXIL Take 10 mg by mouth daily.   rOPINIRole 1 MG tablet Commonly known as:  REQUIP Take 1 mg by mouth at bedtime.   rosuvastatin 5 MG tablet Commonly known as:  CRESTOR Take 5 mg by mouth every morning.       Discharge Assessment: Vitals:   03/29/16 0524 03/29/16 0836  BP: 106/66 (!) 182/87  Pulse: 86   Resp: 20   Temp: 98.9 F (37.2 C)    Skin clean, dry and intact without evidence of skin break down, no evidence of skin tears noted. IV catheter discontinued intact. Site without signs and symptoms of complications - no redness or edema noted at insertion site, patient denies c/o pain - only slight tenderness at site.  Dressing with slight pressure applied.  D/c  Instructions-Education: Discharge instructions given to patient/family with verbalized understanding. D/c education completed with patient/family including follow up instructions, medication list, d/c activities limitations if indicated, with other d/c instructions as indicated by MD - patient able to verbalize understanding, all questions fully answered. Patient instructed to return to ED, call 911, or call MD for any changes in condition.  Patient escorted via WC, and D/C home via private auto.  Vessie Olmsted Consuella Loselaine, RN 03/29/2016 2:37 PM

## 2016-03-29 NOTE — Progress Notes (Signed)
PT Cancellation Note  Patient Details Name: Lucas NewcomerGene R Horn MRN: [id number] DOB: Feb 07, 1936   Cancelled Treatment:    Reason Eval/Treat Not Completed: Other (comment) (Son and pt refused d/t his prognosis and past PT experience).  Will be leaving today for home with son.   Ivar DrapeStout, [name] E 03/29/2016, 2:30 PM   [name] [name], PT MS Acute Rehab Dept. Number: [facility] [phone] and [facility] [phone]

## 2016-03-29 NOTE — Progress Notes (Signed)
[facility] Progress Note  Chief Complaint: Jaundice  Subjective  History:  Has hearing aids in today - can communicate better.  Son also present. Patient reports feeling much better. Has a good appetite this AM.  ROS: Cardiovascular:  no chest pain Respiratory: no dyspnea  Objective:  Med list reviewed  Vital signs in last 24 hrs: Vitals:   03/28/16 2153 03/29/16 0524  BP: (!) 126/53 106/66  Pulse: 77 86  Resp:  20  Temp: 98.7 F (37.1 C) 98.9 F (37.2 C)    Physical Exam Dysarthric as before (motor neuron disease)  HEENT: sclera anicteric, oral mucosa moist without lesions  Neck: supple, no thyromegaly, JVD or lymphadenopathy  Cardiac: RRR without murmurs, S1S2 heard, no peripheral edema  Pulm: clear to auscultation bilaterally, normal RR and effort noted  Abdomen: soft, no tenderness, with active bowel sounds. No guarding or palpable hepatosplenomegaly  Skin; warm and dry, mild jaundice (improved), no rash  Recent Labs:   Recent Labs Lab 03/27/16 0440 03/28/16 0743 03/29/16 0647  WBC 16.0* 11.6* 8.6  HGB 11.3* 11.8* 11.4*  HCT 34.7* 35.5* 33.9*  PLT 141* 116* 123*    Recent Labs Lab 03/28/16 0743  NA 139  K 3.6  CL 105  CO2 27  BUN 12  ALBUMIN 2.7*  ALKPHOS 133*  ALT 139*  AST 93*  GLUCOSE 103*  AM LFTs pending  Recent Labs Lab 03/28/16 1224  INR 1.11    Radiologic studies: See ERCP radiology report  @ASSESSMENTPLANBEGIN @ Assessment: Obstructive jaundice Biliary stricture - malignancy remains a possibility.  Biopsy and brushing results pending Cholangitis - resolved with Abx and new plastic biliary stent   Plan: He can go home today.  I would give him another 7 days of augmentin, since it does not appear that blood cultures were drawn. I will communicate with Dr Christella HartiganJacobs when he returns next week to discuss the next step.  If pathology unrevealing, may require another EUS and probably ERCP for cholangioscopy and ductal  biopsies with possible metal stent placement.  Total time 30 minutes, over half spent in discussion with patient and family   [name] Pager [phone] Mon-Fri 8a-5p [phone] after 5p, weekends, holidays

## 2016-03-29 NOTE — Care Management Note (Signed)
Case Management Note  Patient Details  Name: Lucas NewcomerGene R Horn MRN: [id number] Date of Birth: July 06, 1935  Subjective/Objective:                 Spoke with patient and son at bedside. Patient from home, has walker. Son supportive. Patient and son decline HH, [facility] and [facility] not help much likely due to his underlying neurological disorder. No other CM needs identified.    Action/Plan:  DC to home when medically clear.  Expected Discharge Date:                  Expected Discharge Plan:  Home/Self Care  In-House Referral:     Discharge planning Services  CM Consult  Post Acute Care Choice:    Choice offered to:  Patient, Adult Children  DME Arranged:    DME Agency:     HH Arranged:  Patient Refused HH Agency:     Status of Service:  Completed, signed off  If discussed at [employer] Length of Stay Meetings, dates discussed:    Additional Comments:  Lawerance SabalDebbie Rory Xiang, RN 03/29/2016, 11:59 AM

## 2016-03-29 NOTE — Discharge Summary (Addendum)
Physician Discharge Summary  [name]:[id number] DOB: [birth year]  PCP: Josue Hector, MD  Admit date: 03/25/2016 Discharge date: 03/29/2016  Recommendations for Outpatient Follow-up:  1. Dr. Joette Catching, PCP in one week with repeat labs (CBC & CMP). 2. Dr. Rob Bunting, [facility]: Follow-up regarding outstanding results from recent ERCP procedure.  Home Health: None Equipment/Devices: None    Discharge Condition: Improved and stable  CODE STATUS: DO NOT RESUSCITATE  Diet recommendation: Heart healthy diet.  Discharge Diagnoses:  Principal Problem:   Biliary stricture Active Problems:   Coronary artery disease   Hypertension   Primary lateral sclerosis   Paroxysmal supraventricular tachycardia (HCC)   History of biliary stent insertion   Common bile duct (CBD) obstruction   Cholangitis due to bile duct calculus with obstruction   Brief/Interim Summary: GeneOverbyis a 80 y.o.male,With history of (PLS) motor neuron disease largely bedbound, walks minimally with walker, history of chronic spasticity requiring baclofen pump, CBD stricture status post CBD stent placement 2 years ago, CAD status post stent placement several years ago, GERD, hypertension,who lives at home with his son comes to the ER with 12 hour history of abdominal pain which was generalized but mostly in the right upper quadrant, he denies any fever chills nausea vomiting, no other subjective complaints, in the ER his alkaline phosphatase was elevated, liver enzymes were mildly elevated, CT scan showed questionable CBD stricture.  Assessment & Plan: Abdominal pain 2/2 CBD stricture/obstructive jaundice/cholangitis  - patient with hx with the same in the past had CBD stent placed.  S/P ERCP with stent placement see Impressions report below - pus was found  Initially liver enzymes elevated - now trending down  Treated with empiric empiric unasyn  Tolerating regular consistency diet. As per  GI follow-up today, malignancy remains a possibility in the biliary stricture. Biopsy and brushing results are pending. GI recommends discharge home today, additional 7 days of Augmentin and GI will coordinate further outpatient evaluation and follow-up (If pathology unrevealing, may require another EUS and probably ERCP for cholangioscopy and ductal biopsies with possible metal stent placement). Discussed with GI team. Cholangitis has clinically resolved.  PLS/Motor neuro disease - bed/wheelchair bound, has baclofen pump for muscle spasm Continue current management and supportive treatment . Follows with Dr. Chauncey Cruel, Neurologist with [facility].   CAD - stable no chest pain  Continue ASA and Statin for secondary prevention   Hx of SVT  Remained in sinus rhythm. Continue Cardizem   AKI - likely 2/2 dehydration - Resolved with IVF   Essential hypertension - Continue home Cardizem. ACEI which was held in the hospital to be resumed at discharge.  Anemia and thrombocytopenia - Stable. Outpatient follow-up with repeat CBC.    Procedures:   ERCP 03/28/16: Impression:               - A filling defect consistent with a stone was seen                            on the cholangiogram.                           - The entire main bile duct was dilated.                           - The examination was suspicious for  choledocholithiasis. Complete removal was                            accomplished by sweeping.                           - The biliary tree was swept and debris and pus                            were found.                           - One plastic stent was placed into the common bile                            duct. Recommendation:           - Await cytology results and await path results.   Discharge Instructions  Discharge Instructions    Call MD for:    Complete by:  As directed    Worsening yellowish discoloration of eyes,  skin or urine.   Call MD for:  difficulty breathing, headache or visual disturbances    Complete by:  As directed    Call MD for:  extreme fatigue    Complete by:  As directed    Call MD for:  persistant dizziness or light-headedness    Complete by:  As directed    Call MD for:  persistant nausea and vomiting    Complete by:  As directed    Call MD for:  severe uncontrolled pain    Complete by:  As directed    Call MD for:  temperature >100.4    Complete by:  As directed    Diet - low sodium heart healthy    Complete by:  As directed    Increase activity slowly    Complete by:  As directed        Medication List    TAKE these medications   amoxicillin-clavulanate 875-125 MG tablet Commonly known as:  AUGMENTIN Take 1 tablet by mouth 2 (two) times daily.   aspirin EC 81 MG tablet Take 81 mg by mouth every morning.   COPPER CAPS PO Take 1 capsule by mouth 3 (three) times daily.   diltiazem 240 MG 24 hr capsule Commonly known as:  CARDIZEM CD Take 240 mg by mouth every morning.   esomeprazole 40 MG capsule Commonly known as:  NEXIUM Take 40 mg by mouth daily before breakfast.   fish oil-omega-3 fatty acids 1000 MG capsule Take 2 g by mouth daily.   lisinopril 5 MG tablet Commonly known as:  PRINIVIL,ZESTRIL Take 5 mg by mouth every evening.   meloxicam 7.5 MG tablet Commonly known as:  MOBIC Take 7.5 mg by mouth daily.   multivitamin with minerals Tabs tablet Take 0.5 tablets by mouth daily.   PARoxetine 10 MG tablet Commonly known as:  PAXIL Take 10 mg by mouth daily.   rOPINIRole 1 MG tablet Commonly known as:  REQUIP Take 1 mg by mouth at bedtime.   rosuvastatin 5 MG tablet Commonly known as:  CRESTOR Take 5 mg by mouth every morning.      Follow-up Information    Josue Hector, MD. Schedule an appointment as soon as possible for a visit in 1  week(s).   Specialty:  Family Medicine Why:  Be seen with repeat labs (CBC & CMP). Contact  information: [address] [place] [address] [phone]        Rachael FeeJacobs, Daniel P, MD. Schedule an appointment as soon as possible for a visit.   Specialty:  Gastroenterology Why:  Follow-up regarding outstanding results from recent ERCP procedure. MDs office will follow up with you.  Contact information: [address] [place] [address] [phone]          No Known Allergies  Consultations:  [facility].   Procedures/Studies: Ct Abdomen Pelvis W Contrast  Addendum Date: 03/26/2016   ADDENDUM REPORT: 03/26/2016 10:37 ADDENDUM: Review requested to assess for utility of MRI given the metallic device placement along the right mid abdominal wall, which would lead to susceptibility artifact across portions of the liver and common bile duct. On additional review, there is a 1.4 x 2.4 cm spiculated soft tissue lesion adjacent to the distal CBD (series 2/image 42). There is associated wall thickening/enhancement of the distal CBD (series 2/image 41). There are four small upper abdominal nodes in the periportal region measuring 10-11 mm short axis (series 2/images 33, 35, and 37). This overall appearance is highly concerning for extrahepatic cholangiocarcinoma, less likely pancreatic cancer. In this context, MRI is unlikely to provide additional utility. Even if the lesion was not obscured by susceptibility artifact, the MRI would not obviate the need for potential tissue diagnosis. Consider ERCP/EUS for further evaluation and tissue diagnosis, as clinically warranted given the patient's additional comorbidities. Electronically Signed   By: Charline BillsSriyesh  Krishnan M.D.   On: 03/26/2016 10:37   Result Date: 03/26/2016 CLINICAL DATA:  Abdominal pain EXAM: CT ABDOMEN AND PELVIS WITH CONTRAST TECHNIQUE: Multidetector CT imaging of the abdomen and pelvis was performed using the standard protocol following bolus administration of intravenous contrast. CONTRAST:  100mL ISOVUE-300  IOPAMIDOL (ISOVUE-300) INJECTION 61%, 30mL ISOVUE-300 IOPAMIDOL (ISOVUE-300) INJECTION 61% COMPARISON:  CT abdomen pelvis 12/24/2012 FINDINGS: Lower chest: There is a partially calcified plaque of the left lung base. There is noncalcified plaque of the anterior right pleura.r there is a small fluid collection adjacent to the esophagus (series 2, image 17), measuring 1.5 x 2.0 cm. Hepatobiliary: There is intrahepatic biliary dilatation. The common bile duct is also dilated. There is a stent within the common bile duct, terminating in the duodenum. The gallbladder is surgically absent. Pancreas: There is senescent fatty atrophy of the pancreas. Spleen: Normal. Adrenals/Urinary Tract: Normal adrenal glands. No hydronephrosis or solid renal mass. Stomach/Bowel: There is sigmoid diverticulosis without acute inflammation. No abdominal fluid collection. Normal appendix. Vascular/Lymphatic: There is atherosclerotic calcification of the non aneurysmal abdominal aorta. No abdominal or pelvic adenopathy. Reproductive: Normal prostate and seminal vesicles. Musculoskeletal: There is lumbosacral fusion hardware with associated disc spacer material. There is mild subsidence at the inferior endplate of L5. There is grade 1 anterolisthesis at this level. There is a spinal stimulator device with its battery pack in the subcutaneous tissues of the right lower quadrant. Unchanged T12 compression fracture. Other: There is fluid in the right inguinal canal, within a fat containing inguinal hernia. IMPRESSION: 1. Intrahepatic and extrahepatic biliary dilatation with stent within the common bile duct. This study is not able to demonstrate whether the stent is patent. Further evaluation with ERCP, MRI with biliary specific contrast agent or cholangiography may be helpful. 2. Multiple pleural plaques, minimally changed compared to 12/07/2010, likely indicating prior asbestos exposure. 3. Small, fat and fluid containing right  inguinal hernia.  4. Aortic atherosclerosis. Electronically Signed: By: Deatra Robinson M.D. On: 03/26/2016 05:15   Dg Ercp  Result Date: 03/28/2016 CLINICAL DATA:  80 year old male undergoing ERCP for bile duct stricture EXAM: ERCP TECHNIQUE: Multiple spot images obtained with the fluoroscopic device and submitted for interpretation post-procedure. FLUOROSCOPY TIME:  Fluoroscopy Time: 5 minutes 9 seconds reported please see GI operative note for further detail COMPARISON:  CT scan of the abdomen and pelvis 03/26/2016 FINDINGS: A total of 11 intraoperative spot films demonstrate a flexible endoscope in the descending duodenum with wire cannulation of the common bile duct. Contrast opacification of the biliary tree reveals irregular narrowing of the distal common bile duct. Suggestion of filling defects as well concerning for choledocholithiasis. Subsequent images demonstrate sphincterotomy and balloon sweep of the common duct. On the final image, there is been placement of a plastic biliary stent. IMPRESSION: ERCP with sphincterotomy, balloon sweep of the common bile duct and placement of a plastic biliary stent. Irregular narrowing of the distal common bile duct prior to the above described interventions with a suggestion of filling defects. This may represent a combination of distal common bile duct stricture with sludge and/or small stones. Malignancy is difficult to exclude radiographically. Please correlate with GI operative note and biopsy results if obtained. These images were submitted for radiologic interpretation only. Please see the procedural report for the amount of contrast and the fluoroscopy time utilized. Electronically Signed   By: Malachy Moan M.D.   On: 03/28/2016 10:35   Dg Abdomen Acute W/chest  Result Date: 03/25/2016 CLINICAL DATA:  Generalized abdominal pain and vomiting, onset this evening. EXAM: DG ABDOMEN ACUTE W/ 1V CHEST COMPARISON:  12/26/2012 FINDINGS: Bile duct stent. Cholecystectomy  clips. The abdominal gas pattern is negative for obstruction or perforation. No biliary or urinary calculi are evident. Implanted stimulator superimposes the right abdomen. The upright view of the chest demonstrates stable mild lung base opacities, possibly scarring or atelectasis. No consolidation. No large effusions. IMPRESSION: No radiographic evidence of bowel obstruction or perforation. No acute findings in the chest. Electronically Signed   By: Ellery Plunk M.D.   On: 03/25/2016 23:55      Subjective: Patient denies complaints. Tolerated a regular consistency diet without abdominal pain, nausea, vomiting fever or chills. Anxious to go home. As per son at bedside, doing much better and no acute issues reported.  Discharge Exam:  Vitals:   03/28/16 1432 03/28/16 2153 03/29/16 0524 03/29/16 0836  BP: (!) 101/45 (!) 126/53 106/66 (!) 182/87  Pulse: 68 77 86   Resp: 15  20   Temp: 97.8 F (36.6 C) 98.7 F (37.1 C) 98.9 F (37.2 C)   TempSrc: Oral  Oral   SpO2: 95% 99% 95% 97%  Weight:      Height:        General: Pleasant elderly male, moderately built and nourished, sitting comfortably at edge of bed. Son at bedside. Cardiovascular: S1 & S2 heard, RRR, S1/S2 +. No murmurs, rubs, gallops or clicks. No JVD or pedal edema. Telemetry: Sinus rhythm. Respiratory: Clear to auscultation without wheezing, rhonchi or crackles. No increased work of breathing. Abdominal:  Non distended, non tender & soft. No organomegaly or masses appreciated. Normal bowel sounds heard. Baclofen pump seen at right mid quadrant. CNS: Alert and oriented. No focal deficits. Dysarthric speech-chronic. Extremities: no edema, no cyanosis. Moves all limbs symmetrically.    The results of significant diagnostics from this hospitalization (including imaging, microbiology, ancillary and laboratory) are  listed below for reference.     Microbiology: No results found for this or any previous visit (from the past  240 hour(s)).   Labs: BNP (last 3 results) No results for input(s): BNP in the last 8760 hours. Basic Metabolic Panel:  Recent Labs Lab 03/25/16 2244 03/26/16 1811 03/27/16 0440 03/28/16 0743 03/29/16 0647  NA 143  --  138 139 141  K 4.0  --  4.1 3.6 3.6  CL 101  --  105 105 106  CO2 26  --  25 27 29   GLUCOSE 182*  --  89 103* 87  BUN 17  --  22* 12 9  CREATININE 1.47* 1.36* 1.29* 1.07 0.94  CALCIUM 10.0  --  8.4* 8.8* 8.5*   Liver Function Tests:  Recent Labs Lab 03/25/16 2244 03/27/16 0440 03/28/16 0743 03/29/16 0647  AST 383* 208* 93* 60*  ALT 159* 217* 139* 106*  ALKPHOS 200* 149* 133* 155*  BILITOT 1.1 3.3* 2.5* 2.1*  PROT 8.0 5.5* 5.6* 5.5*  ALBUMIN 4.4 2.8* 2.7* 2.6*    Recent Labs Lab 03/25/16 2244  LIPASE 20   CBC:  Recent Labs Lab 03/25/16 2244 03/26/16 1811 03/27/16 0440 03/28/16 0743 03/29/16 0647  WBC 6.3 19.5* 16.0* 11.6* 8.6  HGB 14.4 12.6* 11.3* 11.8* 11.4*  HCT 44.5 37.2* 34.7* 35.5* 33.9*  MCV 89.9 86.7 87.6 86.4 85.4  PLT 233 164 141* 116* 123*   Urinalysis    Component Value Date/Time   COLORURINE AMBER (A) 03/26/2016 0220   APPEARANCEUR CLEAR 03/26/2016 0220   LABSPEC 1.013 03/26/2016 0220   PHURINE 6.0 03/26/2016 0220   GLUCOSEU NEGATIVE 03/26/2016 0220   HGBUR NEGATIVE 03/26/2016 0220   BILIRUBINUR NEGATIVE 03/26/2016 0220   KETONESUR NEGATIVE 03/26/2016 0220   PROTEINUR NEGATIVE 03/26/2016 0220   UROBILINOGEN 1.0 12/24/2012 1210   NITRITE NEGATIVE 03/26/2016 0220   LEUKOCYTESUR NEGATIVE 03/26/2016 0220    Discussed in detail with patient's son at bedside. Updated care and answered questions.  Time coordinating discharge: Over 30 minutes  SIGNED:  Marcellus ScottHONGALGI,Annaly Skop, MD, FACP, FHM. [facility] Pager [phone]  If 7PM-7AM, please contact night-coverage [email or web address] Password [email or web address] 03/29/2016, 1:08 PM

## 2016-03-29 NOTE — Progress Notes (Signed)
Patient discharge teaching given, including activity, diet, follow-up appoints, and medications. Patient verbalized understanding of all discharge instructions. IV access was d/c'd. Vitals are stable. Skin is intact except as charted in most recent assessments. Pt to be escorted out by NT, to be driven home by his son.

## 2016-04-11 ENCOUNTER — Telehealth: Payer: Self-pay

## 2016-04-11 ENCOUNTER — Other Ambulatory Visit: Payer: Self-pay

## 2016-04-11 DIAGNOSIS — K839 Disease of biliary tract, unspecified: Secondary | ICD-10-CM

## 2016-04-11 NOTE — Telephone Encounter (Signed)
-----   Message from Rachael Feeaniel P Jacobs, MD sent at 04/11/2016 12:08 PM EST ----- Cancel the 1/25 appt.  I prefer this not being done during a hospital week because it will likely be a 2 hour long case.  Please change to the next non-hosp week EUS Thursday.  Thanks  dj  ----- Message ----- From: Annett FabianSheri L Jones, RN Sent: 04/11/2016  11:09 AM To: Rachael Feeaniel P Jacobs, MD  I scheduled him for EUS,ERCP with [facility] for 1/25.  The rep can't come on this day.  Do you want me to move it to your hospital week or is it ok for the week after?  Sheri

## 2016-04-11 NOTE — Telephone Encounter (Signed)
Patient is notified of recommendations for ERCP/EUS and consents to proceed. [facility] is [facility]  He verbalized understanding to be NPO after midnight and arrive at 8:30 am in admitting with his driver

## 2016-04-11 NOTE — Telephone Encounter (Signed)
He is rescheduled to 05/04/16 8:45.  Patient will need to arrive at 7:15  Left message for patient to call back

## 2016-04-11 NOTE — Telephone Encounter (Signed)
-----   Message from Milus Banister, MD sent at 04/07/2016  8:52 AM EST -----  Patty, He needs upper EUS, + ERCP with [facility].  Next available EUS Thursday with MAC sedation for abnormal bile duct. Let [facility] endo know I'll need the [employer] rep present.  Thanks  Barrett Henle, As we spoke, we'll get this set up. Thanks   ----- Message ----- From: Milus Banister, MD Sent: 04/05/2016   1:00 PM To: Milus Banister, MD, Doran Stabler, MD  Mallie Mussel, I looked over his records, agree with your plan.  Let's talk when your back at the office.  Happy new year.  dj  ----- Message ----- From: Doran Stabler, MD Sent: 03/29/2016   4:21 PM To: Milus Banister, MD  Linna Hoff,    When you return, please review this man's chart.  You saw him for EUS/ERCP 3 yrs ago after referral from [facility] [place].  Lost to follow up, came back with cholangitis.  [place] did consult over holiday and gave to me.  See my ERCP report and imaging. Some worrisome regional adenopathy.  Ampulla Bx and CBD brushing pending, but as it stands now I think he probably needs repeat EUS and ERCP with [facility] to see if he has malignancy.  I will be off Jan 2-3 after working the holiday, so I hoped you could review it and then we can discuss later in the week.  Thanks.  Mallie Mussel

## 2016-04-12 NOTE — Telephone Encounter (Signed)
Discussed with the patient that need to move the procedure. He is rescheduled to 05/04/16 8:45.  He verbalized understanding of instrucitons.

## 2016-05-04 ENCOUNTER — Ambulatory Visit (HOSPITAL_COMMUNITY): Payer: Medicare Other

## 2016-05-04 ENCOUNTER — Encounter (HOSPITAL_COMMUNITY): Payer: Self-pay

## 2016-05-04 ENCOUNTER — Ambulatory Visit (HOSPITAL_COMMUNITY): Payer: Medicare Other | Admitting: Anesthesiology

## 2016-05-04 ENCOUNTER — Encounter (HOSPITAL_COMMUNITY)
Admission: RE | Disposition: A | Discharge status: Home / Self Care | Payer: Self-pay | Source: Ambulatory Visit | Attending: Gastroenterology

## 2016-05-04 ENCOUNTER — Ambulatory Visit (HOSPITAL_COMMUNITY)
Admission: RE | Admit: 2016-05-04 | Discharge: 2016-05-04 | Disposition: A | Discharge status: Home / Self Care | Payer: Medicare Other | Source: Ambulatory Visit | Attending: Gastroenterology | Admitting: Gastroenterology

## 2016-05-04 DIAGNOSIS — K219 Gastro-esophageal reflux disease without esophagitis: Secondary | ICD-10-CM | POA: Diagnosis not present

## 2016-05-04 DIAGNOSIS — I10 Essential (primary) hypertension: Secondary | ICD-10-CM | POA: Diagnosis not present

## 2016-05-04 DIAGNOSIS — I2510 Atherosclerotic heart disease of native coronary artery without angina pectoris: Secondary | ICD-10-CM | POA: Diagnosis not present

## 2016-05-04 DIAGNOSIS — K831 Obstruction of bile duct: Secondary | ICD-10-CM | POA: Insufficient documentation

## 2016-05-04 DIAGNOSIS — Z955 Presence of coronary angioplasty implant and graft: Secondary | ICD-10-CM | POA: Insufficient documentation

## 2016-05-04 DIAGNOSIS — K838 Other specified diseases of biliary tract: Secondary | ICD-10-CM | POA: Diagnosis not present

## 2016-05-04 DIAGNOSIS — K839 Disease of biliary tract, unspecified: Secondary | ICD-10-CM | POA: Diagnosis not present

## 2016-05-04 DIAGNOSIS — K2980 Duodenitis without bleeding: Secondary | ICD-10-CM | POA: Diagnosis not present

## 2016-05-04 DIAGNOSIS — G1223 Primary lateral sclerosis: Secondary | ICD-10-CM | POA: Insufficient documentation

## 2016-05-04 DIAGNOSIS — K830 Cholangitis: Secondary | ICD-10-CM | POA: Diagnosis not present

## 2016-05-04 DIAGNOSIS — Z87891 Personal history of nicotine dependence: Secondary | ICD-10-CM | POA: Insufficient documentation

## 2016-05-04 DIAGNOSIS — K8020 Calculus of gallbladder without cholecystitis without obstruction: Secondary | ICD-10-CM

## 2016-05-04 HISTORY — PX: EUS: SHX5427

## 2016-05-04 HISTORY — PX: FINE NEEDLE ASPIRATION: SHX6590

## 2016-05-04 HISTORY — PX: SPYGLASS CHOLANGIOSCOPY: SHX5441

## 2016-05-04 HISTORY — PX: ERCP: SHX5425

## 2016-05-04 SURGERY — FINE NEEDLE ASPIRATION
Anesthesia: General

## 2016-05-04 MED ORDER — ONDANSETRON HCL 4 MG/2ML IJ SOLN
INTRAMUSCULAR | Status: DC | PRN
  Administered 2016-05-04: 4 mg via INTRAVENOUS

## 2016-05-04 MED ORDER — INDOMETHACIN 50 MG RE SUPP
RECTAL | Status: AC
  Filled 2016-05-04: qty 2

## 2016-05-04 MED ORDER — LACTATED RINGERS IV SOLN
INTRAVENOUS | Status: DC | PRN
  Administered 2016-05-04: 08:00:00 via INTRAVENOUS

## 2016-05-04 MED ORDER — INDOMETHACIN 50 MG RE SUPP: 100.0000 mg | Freq: Once | RECTAL | Status: DC

## 2016-05-04 MED ORDER — SODIUM CHLORIDE 0.9 % IV SOLN
1.5000 g | INTRAVENOUS | Status: AC
  Administered 2016-05-04: 1.5 g via INTRAVENOUS
  Filled 2016-05-04: qty 1.5

## 2016-05-04 MED ORDER — EPHEDRINE SULFATE 50 MG/ML IJ SOLN
INTRAMUSCULAR | Status: DC | PRN
  Administered 2016-05-04 (×2): 5 mg via INTRAVENOUS
  Administered 2016-05-04: 10 mg via INTRAVENOUS

## 2016-05-04 MED ORDER — SUGAMMADEX SODIUM 200 MG/2ML IV SOLN
INTRAVENOUS | Status: AC
  Filled 2016-05-04: qty 2

## 2016-05-04 MED ORDER — SUCCINYLCHOLINE CHLORIDE 200 MG/10ML IV SOSY
PREFILLED_SYRINGE | INTRAVENOUS | Status: AC
  Filled 2016-05-04: qty 10

## 2016-05-04 MED ORDER — SODIUM CHLORIDE 0.9 % IV SOLN
INTRAVENOUS | Status: DC | PRN
  Administered 2016-05-04: 40 mL

## 2016-05-04 MED ORDER — ONDANSETRON HCL 4 MG/2ML IJ SOLN: 4.0000 mg | Freq: Once | INTRAMUSCULAR | Status: DC | PRN

## 2016-05-04 MED ORDER — ROCURONIUM BROMIDE 50 MG/5ML IV SOSY
PREFILLED_SYRINGE | INTRAVENOUS | Status: AC
  Filled 2016-05-04: qty 5

## 2016-05-04 MED ORDER — PHENYLEPHRINE HCL 10 MG/ML IJ SOLN
INTRAMUSCULAR | Status: DC | PRN
  Administered 2016-05-04: 40 ug via INTRAVENOUS
  Administered 2016-05-04 (×2): 80 ug via INTRAVENOUS

## 2016-05-04 MED ORDER — SODIUM CHLORIDE 0.9 % IV SOLN: INTRAVENOUS | Status: DC

## 2016-05-04 MED ORDER — ROCURONIUM BROMIDE 50 MG/5ML IV SOSY
PREFILLED_SYRINGE | INTRAVENOUS | Status: DC | PRN
Start: 2016-05-04 — End: 2016-05-04
  Administered 2016-05-04: 40 mg via INTRAVENOUS

## 2016-05-04 MED ORDER — FENTANYL CITRATE (PF) 100 MCG/2ML IJ SOLN: 25.0000 ug | INTRAMUSCULAR | Status: DC | PRN

## 2016-05-04 MED ORDER — LIDOCAINE 2% (20 MG/ML) 5 ML SYRINGE
INTRAMUSCULAR | Status: DC | PRN
  Administered 2016-05-04: 50 mg via INTRAVENOUS

## 2016-05-04 MED ORDER — LIDOCAINE 2% (20 MG/ML) 5 ML SYRINGE
INTRAMUSCULAR | Status: AC
  Filled 2016-05-04: qty 5

## 2016-05-04 MED ORDER — ONDANSETRON HCL 4 MG/2ML IJ SOLN
INTRAMUSCULAR | Status: AC
  Filled 2016-05-04: qty 2

## 2016-05-04 MED ORDER — GLUCAGON HCL RDNA (DIAGNOSTIC) 1 MG IJ SOLR
INTRAMUSCULAR | Status: AC
  Filled 2016-05-04: qty 1

## 2016-05-04 MED ORDER — SUGAMMADEX SODIUM 200 MG/2ML IV SOLN
INTRAVENOUS | Status: DC | PRN
  Administered 2016-05-04: 150 mg via INTRAVENOUS

## 2016-05-04 MED ORDER — PROPOFOL 10 MG/ML IV BOLUS
INTRAVENOUS | Status: AC
  Filled 2016-05-04: qty 60

## 2016-05-04 MED ORDER — EPHEDRINE 5 MG/ML INJ
INTRAVENOUS | Status: AC
  Filled 2016-05-04: qty 10

## 2016-05-04 MED ORDER — PROPOFOL 10 MG/ML IV BOLUS
INTRAVENOUS | Status: DC | PRN
  Administered 2016-05-04: 20 mg via INTRAVENOUS
  Administered 2016-05-04: 80 mg via INTRAVENOUS

## 2016-05-04 NOTE — H&P (Signed)
HPI: This is a frail, nearly bedbound man with chronic bile duct stricture  He underwent ERCP Dr. Jena Gaussourk in 2014 when he presented with cholangitis.  Sphincterotomy, stent placed across distal CBD stricture, bile duct brushing negative.  Follow up ERCP 2014 with EUS Dr. Christella HartiganJacobs; distal CBD stricture, stent removed, stricture brushed (neg) EUS suggested thickened distal CBD. He was recommended to have Spyglass. Tertiary referral made by Dr. Jena Gaussourk, patient never went.   3 years later, he represented with cholangitis. CT scan showed dilated CBD, periportal adenopathy, there was question about stent being in place however must have been calcific changes in bile duct because ERCP that admission found no stent.  There was pus, stone particles, distal CBD again brushed (neg) and biopsied (neg), restented.  Now here for further workup of CBD stricture, periportal adenopathy.  ROS: complete GI ROS as described in HPI.  Constitutional:  No unintentional weight loss   Past Medical History:  Diagnosis Date  . Anemia   . Common biliary duct stricture    Refer to past surgical history. [facility] Dr. Corliss Parishodd Baron, referred for direct cholangioscopy. Stent remains in since 01/2013.  Marland Kitchen. Coronary artery disease    taxus stent to the right coronary artery 2005 low risk cardiolite 2008  . GERD (gastroesophageal reflux disease)   . Hypertension   . Primary lateral sclerosis    [facility]  . Spondylolisthesis at L5-S1 level    fusion 2010  . SVT (supraventricular tachycardia) (HCC)    documented with a rate of 170 BPM 11/09 adenosine treatment converted to regular rhythm patient placed on diltiazem  . Syncope    in the past thought possibly vasovagal. no palpitations at this time    Past Surgical History:  Procedure Laterality Date  . ANGIOPLASTY / STENTING FEMORAL  2005  . BACLOFEN PUMP REFILL  02/2010  . BILIARY STENT PLACEMENT N/A 12/25/2012   Procedure:  BILIARY STENT PLACEMENT;  Surgeon: Corbin Adeobert M Rourk, MD;  Location: [facility];  Service: Endoscopy;  Laterality: N/A;  . CAROTID ENDARTERECTOMY    . CAROTID ENDARTERECTOMY    . CHOLECYSTECTOMY    . ENDOSCOPIC RETROGRADE CHOLANGIOPANCREATOGRAPHY (ERCP) WITH PROPOFOL N/A 01/30/2013   Dr. Christella HartiganJacobs: 2 mm long fixed distal common bile duct stricture located 5 mm proximal to a biliary orifice. Bowel duct brushings were benign. The patient was restented with a plastic biliary stent.  Marland Kitchen. ERCP N/A 12/25/2012   VHQ:[id number]:Diffusely dilated biliary tree with focal narrowing of the distal common bile duct of uncertain significance/Status post biliary sphincterotomy and balloon dredging with recovery of sludge material as described above/Status post placement of a 10-French, 5 cm biliary stent  . ERCP N/A 03/28/2016   Procedure: ENDOSCOPIC RETROGRADE CHOLANGIOPANCREATOGRAPHY (ERCP);  Surgeon: Sherrilyn RistHenry L Danis III, MD;  [facility];  Service: Endoscopy;  Laterality: N/A;  . EUS N/A 01/30/2013   Dr. Christella HartiganJacobs: Walls of extrahepatic bile duct thick at 2 mm diffusely, more than usual hypoechoic soft tissue at the distal common bile duct, questionable him representing mass versus prominent ampulla versus reactive from stent. Stent removed and sent to cytology, I do not see any results for this.  Marland Kitchen. REMOVAL OF STONES N/A 12/25/2012   Procedure: REMOVAL OF STONES;  Surgeon: Corbin Adeobert M Rourk, MD;  Location: [facility];  Service: Endoscopy;  Laterality: N/A;  . SPHINCTEROTOMY N/A 12/25/2012   Procedure: SPHINCTEROTOMY;  Surgeon: Corbin Adeobert M Rourk, MD;  Location: [facility];  Service:  Endoscopy;  Laterality: N/A;    Current Facility-Administered Medications  Medication Dose Route Frequency Provider Last Rate Last Dose  . 0.9 %  sodium chloride infusion   Intravenous Continuous Rachael Fee, MD      . ampicillin-sulbactam (UNASYN) 1.5 g in sodium chloride 0.9 % 50 mL IVPB  1.5 g Intravenous To Endo Rachael Fee, MD      . indomethacin  (INDOCIN) 50 MG suppository 100 mg  100 mg Rectal Once Rachael Fee, MD        Allergies as of 04/10/2016  . (No Known Allergies)    Family History  Problem Relation Age of Onset  . Colon cancer Neg Hx     Social History   Social History  . Marital status: Widowed    Spouse name: N/A  . Number of children: N/A  . Years of education: N/A   Occupational History  . Not on file.   Social History Main Topics  . Smoking status: Former Smoker    Packs/day: 1.50    Years: 45.00    Types: Cigarettes    Quit date: 04/03/1997  . Smokeless tobacco: Never Used  . Alcohol use No  . Drug use: No  . Sexual activity: Not on file   Other Topics Concern  . Not on file   Social History Narrative  . No narrative on file     Physical Exam: BP (!) 182/62   Pulse 61   Temp 98 F (36.7 C)   Resp 20   Ht 5\' 6"  (1.676 m)   Wt 164 lb (74.4 kg)   SpO2 97%   BMI 26.47 kg/m  Constitutional: chronically ill, hoh, frail appearing Psychiatric: alert and oriented x3 Abdomen: soft, nontender, nondistended, no obvious ascites, no peritoneal signs, normal bowel sounds No peripheral edema noted in lower extremities  Assessment and plan: 81 y.o. male with chronic CBD stricture  Benign vs. Malignant etiology;  He is frail, elderly; would not be a candidate for surgery and probably not even a candidate for chemo if we knew for certain that he has a malignancy.  Will proceed with EUS, ERCP, likely spyglass today.  Please see the "Patient Instructions" section for addition details about the plan.  Rob Bunting, MD [facility] 05/04/2016, 8:17 AM

## 2016-05-04 NOTE — Op Note (Signed)
[facility] Patient Name: [name] Procedure Date: 05/04/2016 MRN: [id number] Attending MD: Rachael Fee , MD Date of Birth: Apr 01, 1936 CSN: [id number] Age: 81 Admit Type: Outpatient Procedure:                ERCP Indications:              abnormal bile duct; see indication from EUS done                            just prior to this exam. Providers:                Rachael Fee, MD, Omelia Blackwater RN, RN,                            Arlee Muslim Tech., Technician, Delphia Grates,                            CRNA Referring MD:              Medicines:                General Anesthesia, Cipro 400 mg IV, Indomethacin                            100 mg PR Complications:            No immediate complications. Estimated blood loss:                            None Estimated Blood Loss:     Estimated blood loss: none. Procedure:                Pre-Anesthesia Assessment:                           - Prior to the procedure, a History and Physical                            was performed, and patient medications and                            allergies were reviewed. The patient's tolerance of                            previous anesthesia was also reviewed. The risks                            and benefits of the procedure and the sedation                            options and risks were discussed with the patient.                            All questions were answered, and informed consent                            was obtained. Prior  Anticoagulants: The patient has                            taken no previous anticoagulant or antiplatelet                            agents. ASA Grade Assessment: IV - A patient with                            severe systemic disease that is a constant threat                            to life. After reviewing the risks and benefits,                            the patient was deemed in satisfactory condition to       undergo the procedure.                           After obtaining informed consent, the scope was                            passed under direct vision. Throughout the                            procedure, the patient's blood pressure, pulse, and                            oxygen saturations were monitored continuously. The                            ZO-[id number] ([id number]) scope was introduced through                            the mouth, and used to inject contrast into and                            used to inject contrast into the bile duct. The                            ERCP was accomplished without difficulty. The                            patient tolerated the procedure well. Scope In: Scope Out: Findings:      Using a 44 Autotome over a .035 hydrawire the main bile duct was       cannulated. Cholangiogram showed air bubbles in bile duct, partial       stricture involving the most distal 2cm of the CBD, mildly dilated bile       duct otherwise. Direct cholangioscopy was peformed using SpyGlass       system. The walls of the main bile duct were irregular appearing but       there were no frankly neoplastic appearing areas or tight strictures.  The bile duct was was sampled with Spy Bite forceps. The ampullary       mucosa was irregular, frond-like but also not overtly neoplastic       appearing. This was sampled with forceps. Finally, a 10mm 6cm length       fully covered SEMS was then placed across the distal CBD stricture in       good position. Impression:               - Incomplete distal CBD stricture of unclear                            etiology. Direct cholangioscopy showed somewhat                            irregular bile duct walls throughout but no overtly                            neoplastic changes. The bile duct wall was sampled                            with Spy Bite forceps. The major papilla mucosa was                            irregular as well (same as  described in 03/2016                            ERCP) sampled with forceps. A 6cm long 10mm                            diameter fully covered metal stent was placed                            across the distal CBD stricture. Moderate Sedation:      N/A- Per Anesthesia Care Recommendation:           - Discharge patient to home (ambulatory).                           - Await pathology results. Procedure Code(s):        --- Professional ---                           [phone], Endoscopic retrograde                            cholangiopancreatography (ERCP); with placement of                            endoscopic stent into biliary or pancreatic duct,                            including pre- and post-dilation and guide wire                            passage, when performed, including sphincterotomy,  when performed, each stent                           [id number], Endoscopic retrograde                            cholangiopancreatography (ERCP); with biopsy,                            single or multiple Diagnosis Code(s):        --- Professional ---                           K83.8, Other specified diseases of biliary tract CPT copyright 2016 American Medical Association. All rights reserved. The codes documented in this report are preliminary and upon coder review may  be revised to meet current compliance requirements. Rachael Feeaniel P [name], MD 05/04/2016 11:14:11 AM This report has been signed electronically. Number of Addenda: 0

## 2016-05-04 NOTE — Anesthesia Preprocedure Evaluation (Signed)
Anesthesia Evaluation  Patient identified by MRN, date of birth, ID band Patient awake    Reviewed: Allergy & Precautions, NPO status , Patient's Chart, lab work & pertinent test results  Airway Mallampati: II  TM Distance: >3 FB Neck ROM: Full    Dental  (+) Dental Advisory Given, Edentulous Lower, Edentulous Upper   Pulmonary former smoker,    Pulmonary exam normal breath sounds clear to auscultation       Cardiovascular hypertension, Pt. on medications + CAD and + Cardiac Stents  Normal cardiovascular exam+ dysrhythmias Supra Ventricular Tachycardia  Rhythm:Regular Rate:Normal     Neuro/Psych Primary lateral sclerosis history of chronic spasticity requiring baclofen pump negative psych ROS   GI/Hepatic GERD  Medicated,Biliary stricture    Endo/Other  negative endocrine ROS  Renal/GU negative Renal ROS     Musculoskeletal negative musculoskeletal ROS (+)   Abdominal   Peds  Hematology  (+) Blood dyscrasia, anemia ,   Anesthesia Other Findings Day of surgery medications reviewed with the patient.  Reproductive/Obstetrics                            Anesthesia Physical Anesthesia Plan  ASA: III  Anesthesia Plan: General   Post-op Pain Management:    Induction: Intravenous  Airway Management Planned: Oral ETT  Additional Equipment:   Intra-op Plan:   Post-operative Plan: Extubation in OR  Informed Consent: I have reviewed the patients History and Physical, chart, labs and discussed the procedure including the risks, benefits and alternatives for the proposed anesthesia with the patient or authorized representative who has indicated his/her understanding and acceptance.   Dental advisory given  Plan Discussed with: CRNA  Anesthesia Plan Comments: (Risks/benefits of general anesthesia discussed with patient including risk of damage to teeth, lips, gum, and tongue,  nausea/vomiting, allergic reactions to medications, and the possibility of heart attack, stroke and death.  All patient questions answered.  Patient wishes to proceed.)        Anesthesia Quick Evaluation

## 2016-05-04 NOTE — Discharge Instructions (Signed)
YOU HAD AN ENDOSCOPIC PROCEDURE TODAY: Refer to the procedure report and other information in the discharge instructions given to you for any specific questions about what was found during the examination. If this information does not answer your questions, please call [place] office at [phone] to clarify.  ° °YOU SHOULD EXPECT: Some feelings of bloating in the abdomen. Passage of more gas than usual. Walking can help get rid of the air that was put into your GI tract during the procedure and reduce the bloating. If you had a lower endoscopy (such as a colonoscopy or flexible sigmoidoscopy) you may notice spotting of blood in your stool or on the toilet paper. Some abdominal soreness may be present for a day or two, also. ° °DIET: Your first meal following the procedure should be a light meal and then it is ok to progress to your normal diet. A half-sandwich or bowl of soup is an example of a good first meal. Heavy or fried foods are harder to digest and may make you feel nauseous or bloated. Drink plenty of fluids but you should avoid alcoholic beverages for 24 hours. If you had a esophageal dilation, please see attached instructions for diet.   ° °ACTIVITY: Your care partner should take you home directly after the procedure. You should plan to take it easy, moving slowly for the rest of the day. You can resume normal activity the day after the procedure however YOU SHOULD NOT DRIVE, use power tools, machinery or perform tasks that involve climbing or major physical exertion for 24 hours (because of the sedation medicines used during the test).  ° °SYMPTOMS TO REPORT IMMEDIATELY: °A gastroenterologist can be reached at any hour. Please call [phone]  for any of the following symptoms:  °Following lower endoscopy (colonoscopy, flexible sigmoidoscopy) °Excessive amounts of blood in the stool  °Significant tenderness, worsening of abdominal pains  °Swelling of the abdomen that is new, acute  °Fever of 100° or  higher  °Following upper endoscopy (EGD, EUS, ERCP, esophageal dilation) °Vomiting of blood or coffee ground material  °New, significant abdominal pain  °New, significant chest pain or pain under the shoulder blades  °Painful or persistently difficult swallowing  °New shortness of breath  °Black, tarry-looking or red, bloody stools ° °FOLLOW UP:  °If any biopsies were taken you will be contacted by phone or by letter within the next 1-3 weeks. Call [phone]  if you have not heard about the biopsies in 3 weeks.  °Please also call with any specific questions about appointments or follow up tests. ° °

## 2016-05-04 NOTE — Anesthesia Procedure Notes (Signed)
Procedure Name: Intubation Date/Time: 05/04/2016 8:53 AM Performed by: Dione Booze Pre-anesthesia Checklist: Suction available, Emergency Drugs available, Patient identified and Patient being monitored Patient Re-evaluated:Patient Re-evaluated prior to inductionOxygen Delivery Method: Circle system utilized Preoxygenation: Pre-oxygenation with 100% oxygen Intubation Type: IV induction Laryngoscope Size: Mac and 4 Grade View: Grade I Tube type: Oral Tube size: 7.5 mm Number of attempts: 1 Airway Equipment and Method: Stylet Placement Confirmation: ETT inserted through vocal cords under direct vision,  positive ETCO2 and breath sounds checked- equal and bilateral Secured at: 22 cm Tube secured with: Tape Dental Injury: Teeth and Oropharynx as per pre-operative assessment

## 2016-05-04 NOTE — Op Note (Signed)
[facility] Patient Name: [name] Procedure Date: 05/04/2016 MRN: [id number] Attending MD: Rachael Fee , MD Date of Birth: 1936-03-12 CSN: [id number] Age: 81 Admit Type: Outpatient Procedure:                Upper EUS Indications:              Complex biliary history that began 2014 with                            cholangitis; Dr. Jena Gauss ERCP with neg brushing,                            stent placed across distal CBD stricture. Follow up                            EUS/ERCP Dr. Christella Hartigan 2014; thickened distal CBD;                            several samples all neg for malignancy; replaced                            previous biliary stent and referred to tertiary                            center for Cholangioscopy; that never happened. He                            represented with cholangitis 03/2016; CT suggested                            mass-like region, periportal adenopathy, obstructed                            bile duct again, stented, brushed (again negative). Providers:                Rachael Fee, MD, Omelia Blackwater RN, RN,                            Arlee Muslim Tech., Technician, Delphia Grates,                            CRNA Referring MD:              Medicines:                General Anesthesia Complications:            No immediate complications. Estimated blood loss:                            None. Estimated Blood Loss:     Estimated blood loss: none. Procedure:                Pre-Anesthesia Assessment:                           - Prior to the procedure, a History  and Physical                            was performed, and patient medications and                            allergies were reviewed. The patient's tolerance of                            previous anesthesia was also reviewed. The risks                            and benefits of the procedure and the sedation                            options and risks were discussed with  the patient.                            All questions were answered, and informed consent                            was obtained. Prior Anticoagulants: The patient has                            taken no previous anticoagulant or antiplatelet                            agents. ASA Grade Assessment: IV - A patient with                            severe systemic disease that is a constant threat                            to life. After reviewing the risks and benefits,                            the patient was deemed in satisfactory condition to                            undergo the procedure.                           After obtaining informed consent, the endoscope was                            passed under direct vision. Throughout the                            procedure, the patient's blood pressure, pulse, and                            oxygen saturations were monitored continuously. The  ZO-[id number] ([id number]) scope was introduced through                            the mouth, and advanced to the second part of                            duodenum. The upper EUS was accomplished without                            difficulty. The patient tolerated the procedure                            well. Scope In: Scope Out: Findings:      Endoscopic Finding :      The examined esophagus was endoscopically normal.      The entire examined stomach was endoscopically normal.      The previously placed plastic bile duct stent was removed with a snare       (sent to cytology).      Endosonographic Finding :      1. A vague hypoechoic soft tissue mass was identified       endosonographically, seeming to surround the distal CBD, measuring 1.4cm       across (pancreatic or biliary?). Fine needle aspiration for cytology was       performed. Color Doppler imaging was utilized prior to needle puncture       to confirm a lack of significant vascular structures within the needle        path. Three passes were made with the 25 gauge needle using a       transduodenal approach. A cytotechnologist was present to evaluate the       adequacy of the specimen. Final cytology results are pending. The wall       of the CBD was also thickened from ampulla to about 5cm more proximal       (extent of reliable EUS visibility).      2. The adenopathy noted on recent CT scan was not visible on this exam.      3. The pancreatic parenchyma was normal except for the soft tissue mass       described above.      4. Gallbladder surgically absent.      5. Limited views of the liver, spleen, portal and splenic vessels were       all normal. Impression:               - Previously placed plastic bile duct stent was                            removed, sent to cytology.                           - There was a vague soft tissue mass involving the                            distal CBD, measuring about 1.4cm (biliary lesion                            favored over pancreatic lesion if  this represents                            neoplasm). FNA performed. Moderate Sedation:      N/A- Per Anesthesia Care Recommendation:           - Discharge patient to home (ambulatory).                           - Await cytology review.                           - Will proceed with ERCP now. Procedure Code(s):        --- Professional ---                           [phone], Esophagogastroduodenoscopy, flexible,                            transoral; with transendoscopic ultrasound-guided                            intramural or transmural fine needle                            aspiration/biopsy(s), (includes endoscopic                            ultrasound examination limited to the esophagus,                            stomach or duodenum, and adjacent structures) Diagnosis Code(s):        --- Professional ---                           K83.1, Obstruction of bile duct                           K83.8, Other  specified diseases of biliary tract CPT copyright 2016 American Medical Association. All rights reserved. The codes documented in this report are preliminary and upon coder review may  be revised to meet current compliance requirements. Rachael Feeaniel P Pricilla Moehle, MD 05/04/2016 11:03:49 AM This report has been signed electronically. Number of Addenda: 0

## 2016-05-04 NOTE — Transfer of Care (Signed)
Immediate Anesthesia Transfer of Care Note  Patient: [name]  Procedure(s) Performed: Procedure(s): UPPER ENDOSCOPIC ULTRASOUND (EUS) LINEAR (N/A) UPPER ENDOSCOPIC ULTRASOUND (EUS) RADIAL (N/A) ENDOSCOPIC RETROGRADE CHOLANGIOPANCREATOGRAPHY (ERCP) (N/A) SPYGLASS CHOLANGIOSCOPY (N/A)  Patient Location: PACU and Endoscopy Unit  Anesthesia Type:General  Level of Consciousness: awake and patient cooperative  Airway & Oxygen Therapy: Patient Spontanous Breathing and Patient connected to face mask oxygen  Post-op Assessment: Report given to RN and Post -op Vital signs reviewed and stable  Post vital signs: Reviewed and stable  Last Vitals:  Vitals:   05/04/16 0803  BP: (!) 182/62  Pulse: 61  Resp: 20  Temp: 36.7 C    Last Pain: There were no vitals filed for this visit.       Complications: No apparent anesthesia complications

## 2016-05-04 NOTE — Anesthesia Postprocedure Evaluation (Signed)
Anesthesia Post Note  Patient: D'Arcy R Urquiza  Procedure(s) Performed: Procedure(s) (LRB): UPPER ENDOSCOPIC ULTRASOUND (EUS) LINEAR (N/A) UPPER ENDOSCOPIC ULTRASOUND (EUS) RADIAL (N/A) ENDOSCOPIC RETROGRADE CHOLANGIOPANCREATOGRAPHY (ERCP) (N/A) SPYGLASS CHOLANGIOSCOPY (N/A)  Patient location during evaluation: PACU Anesthesia Type: General Level of consciousness: awake and alert Pain management: pain level controlled Vital Signs Assessment: post-procedure vital signs reviewed and stable Respiratory status: spontaneous breathing, nonlabored ventilation, respiratory function stable and patient connected to nasal cannula oxygen Cardiovascular status: blood pressure returned to baseline and stable Postop Assessment: no signs of nausea or vomiting Anesthetic complications: no       Last Vitals:  Vitals:   05/04/16 1108 05/04/16 1120  BP: (!) 178/62   Pulse: 81   Resp: (!) 23   Temp: 36.4 C 36.6 C    Last Pain:  Vitals:   05/04/16 1108  TempSrc: Oral                 Cecile HearingStephen Edward Turk

## 2016-05-07 ENCOUNTER — Encounter (HOSPITAL_COMMUNITY): Payer: Self-pay | Admitting: Gastroenterology

## 2016-05-09 ENCOUNTER — Telehealth: Payer: Self-pay | Admitting: Gastroenterology

## 2016-05-09 ENCOUNTER — Other Ambulatory Visit: Payer: Self-pay

## 2016-05-09 DIAGNOSIS — K831 Obstruction of bile duct: Secondary | ICD-10-CM

## 2016-05-09 NOTE — Telephone Encounter (Signed)
Dr Christella HartiganJacobs the pt is returning your call for path results.

## 2016-07-18 ENCOUNTER — Ambulatory Visit: Payer: Medicare Other | Admitting: Gastroenterology

## 2016-10-15 ENCOUNTER — Encounter (HOSPITAL_COMMUNITY): Payer: Self-pay

## 2016-10-15 ENCOUNTER — Emergency Department (HOSPITAL_COMMUNITY): Payer: Medicare Other

## 2016-10-15 ENCOUNTER — Emergency Department (HOSPITAL_COMMUNITY)
Admission: EM | Admit: 2016-10-15 | Discharge: 2016-10-15 | Disposition: A | Discharge status: Home / Self Care | Payer: Medicare Other | Attending: Emergency Medicine | Admitting: Emergency Medicine

## 2016-10-15 DIAGNOSIS — Z8679 Personal history of other diseases of the circulatory system: Secondary | ICD-10-CM | POA: Insufficient documentation

## 2016-10-15 DIAGNOSIS — I10 Essential (primary) hypertension: Secondary | ICD-10-CM | POA: Insufficient documentation

## 2016-10-15 DIAGNOSIS — M62838 Other muscle spasm: Secondary | ICD-10-CM | POA: Diagnosis not present

## 2016-10-15 DIAGNOSIS — Z87891 Personal history of nicotine dependence: Secondary | ICD-10-CM | POA: Insufficient documentation

## 2016-10-15 DIAGNOSIS — Z79899 Other long term (current) drug therapy: Secondary | ICD-10-CM | POA: Diagnosis not present

## 2016-10-15 DIAGNOSIS — Z7982 Long term (current) use of aspirin: Secondary | ICD-10-CM | POA: Insufficient documentation

## 2016-10-15 DIAGNOSIS — I2510 Atherosclerotic heart disease of native coronary artery without angina pectoris: Secondary | ICD-10-CM | POA: Insufficient documentation

## 2016-10-15 DIAGNOSIS — Z9861 Coronary angioplasty status: Secondary | ICD-10-CM | POA: Diagnosis not present

## 2016-10-15 LAB — I-STAT CHEM 8, ED
BUN: 16 mg/dL (ref 6–20)
CALCIUM ION: 1.12 mmol/L — AB (ref 1.15–1.40)
CHLORIDE: 103 mmol/L (ref 101–111)
Creatinine, Ser: 1 mg/dL (ref 0.61–1.24)
GLUCOSE: 121 mg/dL — AB (ref 65–99)
HCT: 36 % — ABNORMAL LOW (ref 39.0–52.0)
Hemoglobin: 12.2 g/dL — ABNORMAL LOW (ref 13.0–17.0)
Potassium: 4.2 mmol/L (ref 3.5–5.1)
SODIUM: 139 mmol/L (ref 135–145)
TCO2: 23 mmol/L (ref 0–100)

## 2016-10-15 NOTE — ED Provider Notes (Signed)
[facility] Provider Note   CSN: [id number] Arrival date & time: 10/15/16  1650     History   Chief Complaint Chief Complaint  Patient presents with  . Spasms    HPI [name] is a 81 y.o. male.  Patient complains of some muscle spasms around his neck earlier today that have resolved now   The history is provided by the patient and a relative. No language interpreter was used.  Illness  This is a new problem. The current episode started 1 to 2 hours ago. The problem occurs rarely. The problem has been resolved. Pertinent negatives include no chest pain, no abdominal pain and no headaches. Nothing aggravates the symptoms. Nothing relieves the symptoms. He has tried nothing for the symptoms. The treatment provided significant relief.    Past Medical History:  Diagnosis Date  . Anemia   . Common biliary duct stricture    Refer to past surgical history. [facility] Dr. Corliss Parishodd Baron, referred for direct cholangioscopy. Stent remains in since 01/2013.  Marland Kitchen. Coronary artery disease    taxus stent to the right coronary artery 2005 low risk cardiolite 2008  . GERD (gastroesophageal reflux disease)   . Hypertension   . Primary lateral sclerosis (HCC)    [facility]  . Spondylolisthesis at L5-S1 level    fusion 2010  . SVT (supraventricular tachycardia) (HCC)    documented with a rate of 170 BPM 11/09 adenosine treatment converted to regular rhythm patient placed on diltiazem  . Syncope    in the past thought possibly vasovagal. no palpitations at this time    Patient Active Problem List   Diagnosis Date Noted  . Cholangitis due to bile duct calculus with obstruction   . Bile duct abnormality 03/26/2016  . Biliary stricture 09/02/2013  . History of biliary stent insertion 09/02/2013  . Nonspecific (abnormal) findings on radiological and other examination of biliary tract 01/30/2013  . Paroxysmal supraventricular tachycardia  (HCC) 12/27/2012  . Blood-tinged sputum 12/26/2012  . Acute cholangitis 12/24/2012  . Jaundice 12/24/2012  . Anemia 12/24/2012  . Diverticulosis 12/24/2012  . Right inguinal hernia 12/24/2012  . Esophagitis 12/24/2012  . HOH (hard of hearing) 12/24/2012  . Coronary artery disease   . Hypertension   . GERD (gastroesophageal reflux disease)   . Primary lateral sclerosis (HCC)   . Spondylolisthesis at L5-S1 level     Past Surgical History:  Procedure Laterality Date  . ANGIOPLASTY / STENTING FEMORAL  2005  . BACLOFEN PUMP REFILL  02/2010  . BILIARY STENT PLACEMENT N/A 12/25/2012   Procedure: BILIARY STENT PLACEMENT;  Surgeon: Corbin Adeobert M Rourk, MD;  Location: [facility];  Service: Endoscopy;  Laterality: N/A;  . CAROTID ENDARTERECTOMY    . CAROTID ENDARTERECTOMY    . CHOLECYSTECTOMY    . ENDOSCOPIC RETROGRADE CHOLANGIOPANCREATOGRAPHY (ERCP) WITH PROPOFOL N/A 01/30/2013   Dr. Christella HartiganJacobs: 2 mm long fixed distal common bile duct stricture located 5 mm proximal to a biliary orifice. Bowel duct brushings were benign. The patient was restented with a plastic biliary stent.  Marland Kitchen. ERCP N/A 12/25/2012   GUY:[id number]:Diffusely dilated biliary tree with focal narrowing of the distal common bile duct of uncertain significance/Status post biliary sphincterotomy and balloon dredging with recovery of sludge material as described above/Status post placement of a 10-French, 5 cm biliary stent  . ERCP N/A 03/28/2016   Procedure: ENDOSCOPIC RETROGRADE CHOLANGIOPANCREATOGRAPHY (ERCP);  Surgeon: Sherrilyn RistHenry L Danis III, MD;  Location:  [facility];  Service: Endoscopy;  Laterality: N/A;  . ERCP N/A 05/04/2016   Procedure: ENDOSCOPIC RETROGRADE CHOLANGIOPANCREATOGRAPHY (ERCP);  Surgeon: Rachael Fee, MD;  Location: [facility];  Service: Endoscopy;  Laterality: N/A;  . EUS N/A 01/30/2013   Dr. Christella Hartigan: Walls of extrahepatic bile duct thick at 2 mm diffusely, more than usual hypoechoic soft tissue at the distal common bile duct,  questionable him representing mass versus prominent ampulla versus reactive from stent. Stent removed and sent to cytology, I do not see any results for this.  . EUS N/A 05/04/2016   Procedure: UPPER ENDOSCOPIC ULTRASOUND (EUS) LINEAR;  Surgeon: Rachael Fee, MD;  Location: [facility];  Service: Endoscopy;  Laterality: N/A;  . EUS N/A 05/04/2016   Procedure: UPPER ENDOSCOPIC ULTRASOUND (EUS) RADIAL;  Surgeon: Rachael Fee, MD;  Location: [facility];  Service: Endoscopy;  Laterality: N/A;  . FINE NEEDLE ASPIRATION  05/04/2016   Procedure: FINE NEEDLE ASPIRATION;  Surgeon: Rachael Fee, MD;  Location: [facility];  Service: Endoscopy;;  . REMOVAL OF STONES N/A 12/25/2012   Procedure: REMOVAL OF STONES;  Surgeon: Corbin Ade, MD;  Location: [facility];  Service: Endoscopy;  Laterality: N/A;  . SPHINCTEROTOMY N/A 12/25/2012   Procedure: SPHINCTEROTOMY;  Surgeon: Corbin Ade, MD;  Location: [facility];  Service: Endoscopy;  Laterality: N/A;  . SPYGLASS CHOLANGIOSCOPY N/A 05/04/2016   Procedure: [id number] CHOLANGIOSCOPY;  Surgeon: Rachael Fee, MD;  Location: [facility];  Service: Endoscopy;  Laterality: N/A;       Home Medications    Prior to Admission medications   Medication Sig Start Date End Date Taking? Authorizing Provider  acetaminophen (TYLENOL) 500 MG tablet Take 1,000 mg by mouth every 6 (six) hours as needed for mild pain.   Yes [provider]  aspirin EC 81 MG tablet Take 81 mg by mouth every morning.    Yes [provider]  Copper Gluconate (COPPER CAPS PO) Take 1 capsule by mouth 3 (three) times daily. 2 mg   Yes [provider]  diltiazem (CARDIZEM CD) 240 MG 24 hr capsule Take 240 mg by mouth every morning.    Yes [provider]  esomeprazole (NEXIUM) 40 MG capsule Take 40 mg by mouth daily before breakfast.   Yes [provider]  fish oil-omega-3 fatty acids 1000 MG capsule Take 1 g by mouth daily.    Yes [provider]  Flaxseed, Linseed, (FLAXSEED OIL) 1000 MG CAPS Take 2 capsules by mouth daily.   Yes [provider]  lisinopril (PRINIVIL,ZESTRIL) 5 MG tablet Take 5 mg by mouth every evening.    Yes [provider]  meloxicam (MOBIC) 7.5 MG tablet Take 7.5 mg by mouth daily.   Yes [provider]  Multiple Vitamin (MULTIVITAMIN WITH MINERALS) TABS Take 0.5 tablets by mouth daily.   Yes [provider]  PARoxetine (PAXIL) 10 MG tablet Take 10 mg by mouth daily.   Yes [provider]  rOPINIRole (REQUIP) 1 MG tablet Take 1 mg by mouth 4 (four) times daily.    Yes [provider]  rosuvastatin (CRESTOR) 20 MG tablet Take 20 mg by mouth daily. 09/28/16  Yes [provider]    Family History Family History  Problem Relation Age of Onset  . Colon cancer Neg Hx     Social History Social History  Substance Use Topics  . Smoking status: Former Smoker    Packs/day: 1.50    Years: 45.00  Types: Cigarettes    Quit date: 04/03/1997  . Smokeless tobacco: Never Used  . Alcohol use No     Allergies   Patient has no known allergies.   Review of Systems Review of Systems  Constitutional: Negative for appetite change and fatigue.  HENT: Negative for congestion, ear discharge and sinus pressure.   Eyes: Negative for discharge.  Respiratory: Negative for cough.   Cardiovascular: Negative for chest pain.  Gastrointestinal: Negative for abdominal pain and diarrhea.  Genitourinary: Negative for frequency and hematuria.  Musculoskeletal: Negative for back pain.  Skin: Negative for rash.  Neurological: Negative for seizures and headaches.  Psychiatric/Behavioral: Negative for hallucinations.     Physical Exam Updated Vital Signs BP 128/64 (BP Location: Left Arm)   Pulse 88   Resp 20   SpO2 91%   Physical Exam  Constitutional: He is oriented to person, place, and time. He appears well-developed.  HENT:  Head:  Normocephalic.  Eyes: Conjunctivae and EOM are normal. No scleral icterus.  Neck: Neck supple. No thyromegaly present.  Cardiovascular: Normal rate and regular rhythm.  Exam reveals no gallop and no friction rub.   No murmur heard. Pulmonary/Chest: No stridor. He has no wheezes. He has no rales. He exhibits no tenderness.  Abdominal: He exhibits no distension. There is no tenderness. There is no rebound.  Musculoskeletal: He exhibits no edema.  Lymphadenopathy:    He has no cervical adenopathy.  Neurological: He is oriented to person, place, and time. He exhibits normal muscle tone. Coordination normal.  Skin: No rash noted. No erythema.  Psychiatric: He has a normal mood and affect. His behavior is normal.     ED Treatments / Results  Labs (all labs ordered are listed, but only abnormal results are displayed) Labs Reviewed  I-STAT CHEM 8, ED - Abnormal; Notable for the following:       Result Value   Glucose, Bld 121 (*)    Calcium, Ion 1.12 (*)    Hemoglobin 12.2 (*)    HCT 36.0 (*)    All other components within normal limits    EKG  EKG Interpretation None       Radiology Dg Abd Acute W/chest  Result Date: 10/15/2016 CLINICAL DATA:  Abdominal pain EXAM: DG ABDOMEN ACUTE W/ 1V CHEST COMPARISON:  03/25/2016 FINDINGS: Cardiac shadow is stable. The lungs are hypoinflated with mild basilar atelectatic changes. No focal confluent infiltrate is seen. Scattered large and small bowel gas noted. A biliary stent is noted place and is fully expanded. A pain pump is noted in the right abdomen. Postsurgical changes are seen in the lumbosacral junction. Previously seen plastic biliary stent is again noted projecting over the lumbosacral junction likely within the small bowel. No acute bony abnormality is seen. Chronic compression deformity at T12 is noted. IMPRESSION: Chronic changes as described above.  No acute abnormality is noted. Electronically Signed   By: Alcide Clever M.D.   On:  10/15/2016 18:12    Procedures Procedures (including critical care time)  Medications Ordered in ED Medications - No data to display   Initial Impression / Assessment and Plan / ED Course  I have reviewed the triage vital signs and the nursing notes.  Pertinent labs & imaging results that were available during my care of the patient were reviewed by me and considered in my medical decision making (see chart for details).     Patient with multiple spasms that have improved. Labs x-rays unremarkable. Patient will follow-up with  PCP  Final Clinical Impressions(s) / ED Diagnoses   Final diagnoses:  Muscle spasm    New Prescriptions New Prescriptions   No medications on file     Bethann Berkshire, MD 10/15/16 1929

## 2016-10-15 NOTE — Discharge Instructions (Signed)
Follow up with your md if any problems °

## 2016-10-15 NOTE — ED Notes (Signed)
Pt back from x-ray.

## 2016-10-15 NOTE — ED Triage Notes (Signed)
Pt brought in by EMS. EMS reports that pt was picked up her was having severe spasms in upper and lower extremities. Pt has motor neuron disease and has a pump in abdomen that delivers baclofen. Once in the EMS van spasms went away and pt was back to normal. EMS reports that family reports pt went to church, lunch and took a nap and when he woke up he was having severe pain and spasms

## 2016-10-15 NOTE — ED Notes (Signed)
Pt has slurred speech which is his baseline. No rigidity noted upon entering hospital.

## 2018-02-06 IMAGING — DX DG ABDOMEN ACUTE W/ 1V CHEST
3 series · 3 of 3 positions shown · non-contrast
Comparison: 12/26/2012

CLINICAL DATA: Generalized abdominal pain and vomiting, onset this
evening.

EXAM:
DG ABDOMEN ACUTE W/ 1V CHEST

[chest pa]
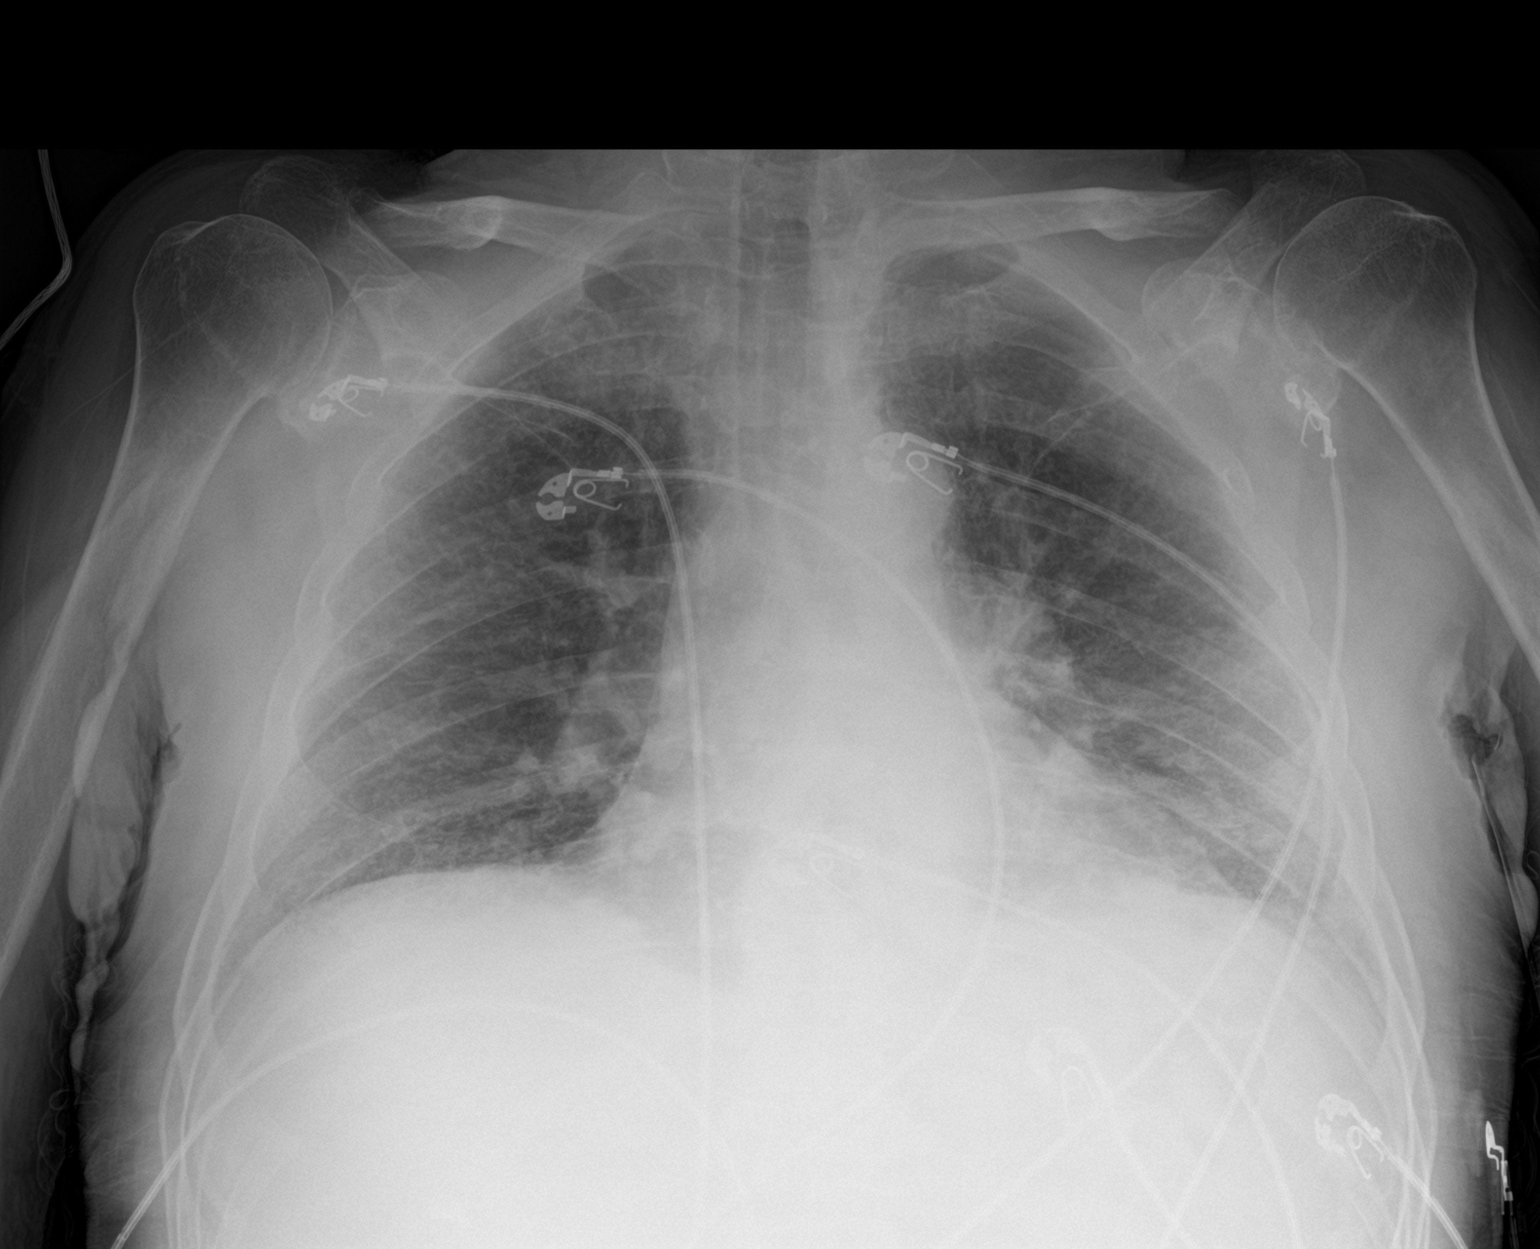

[abdomen erect]
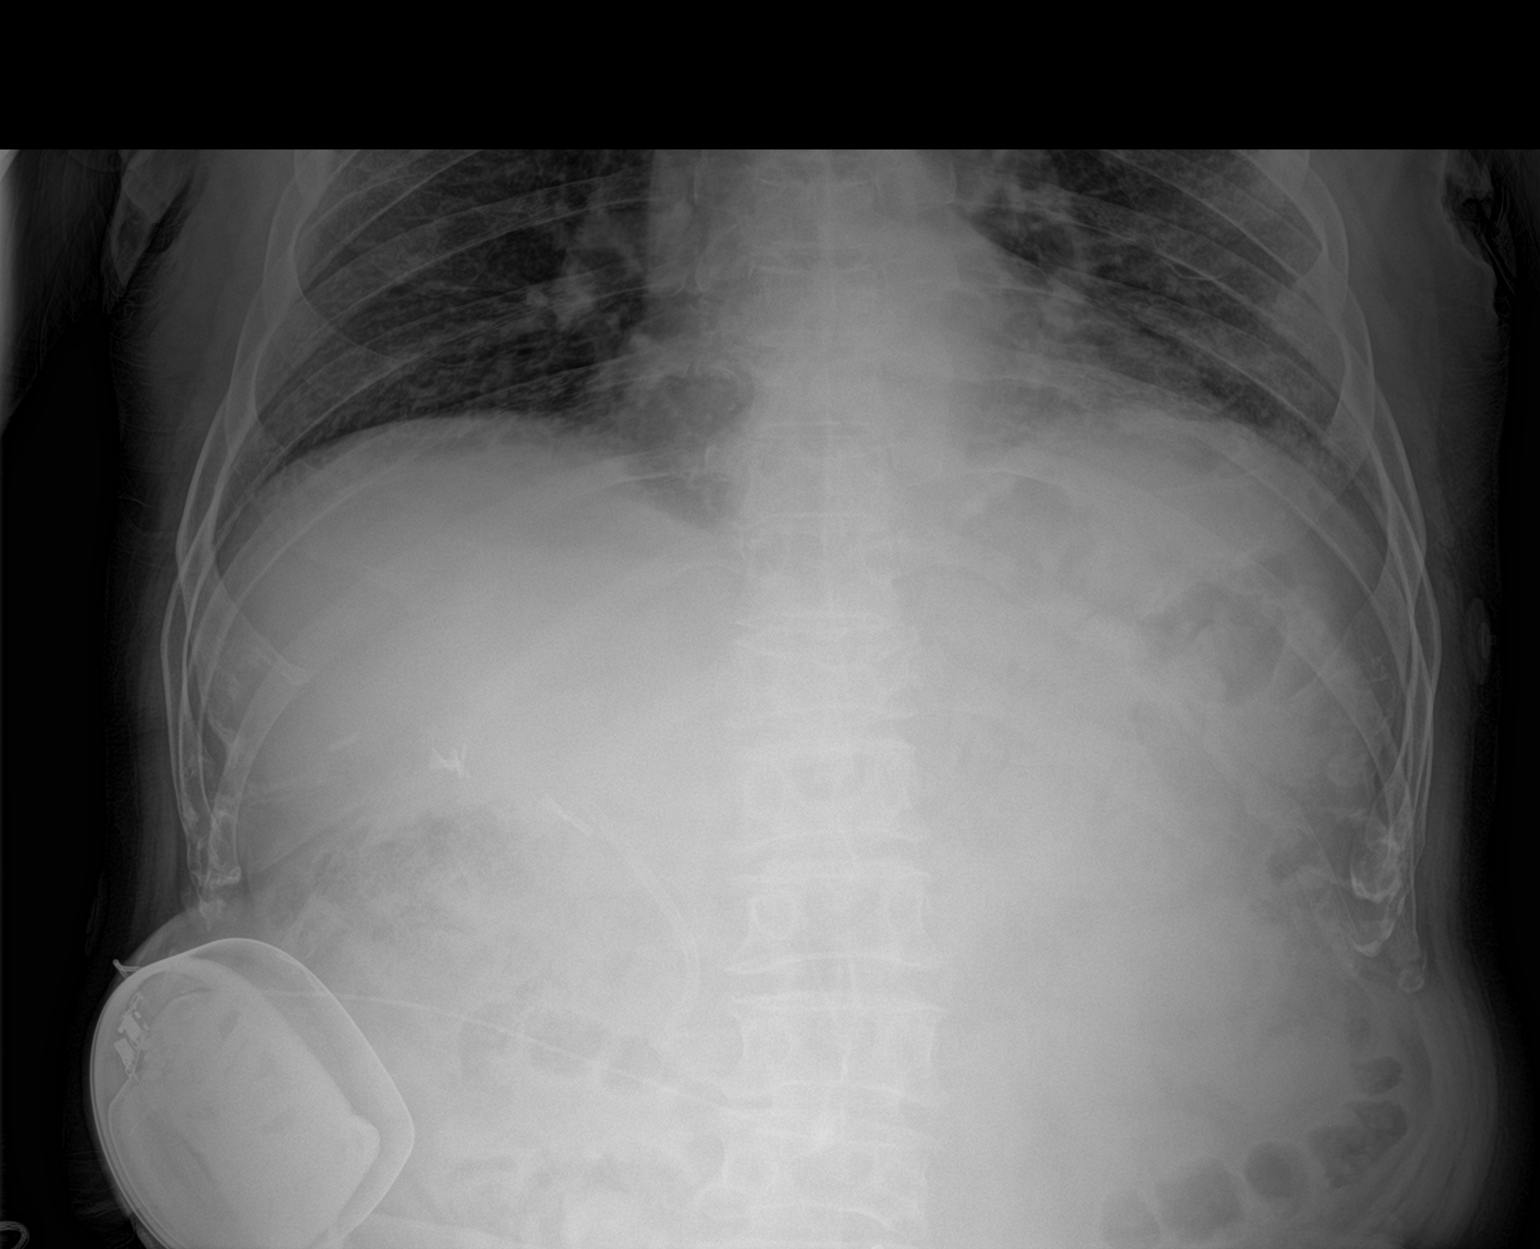

[abdomen supine]
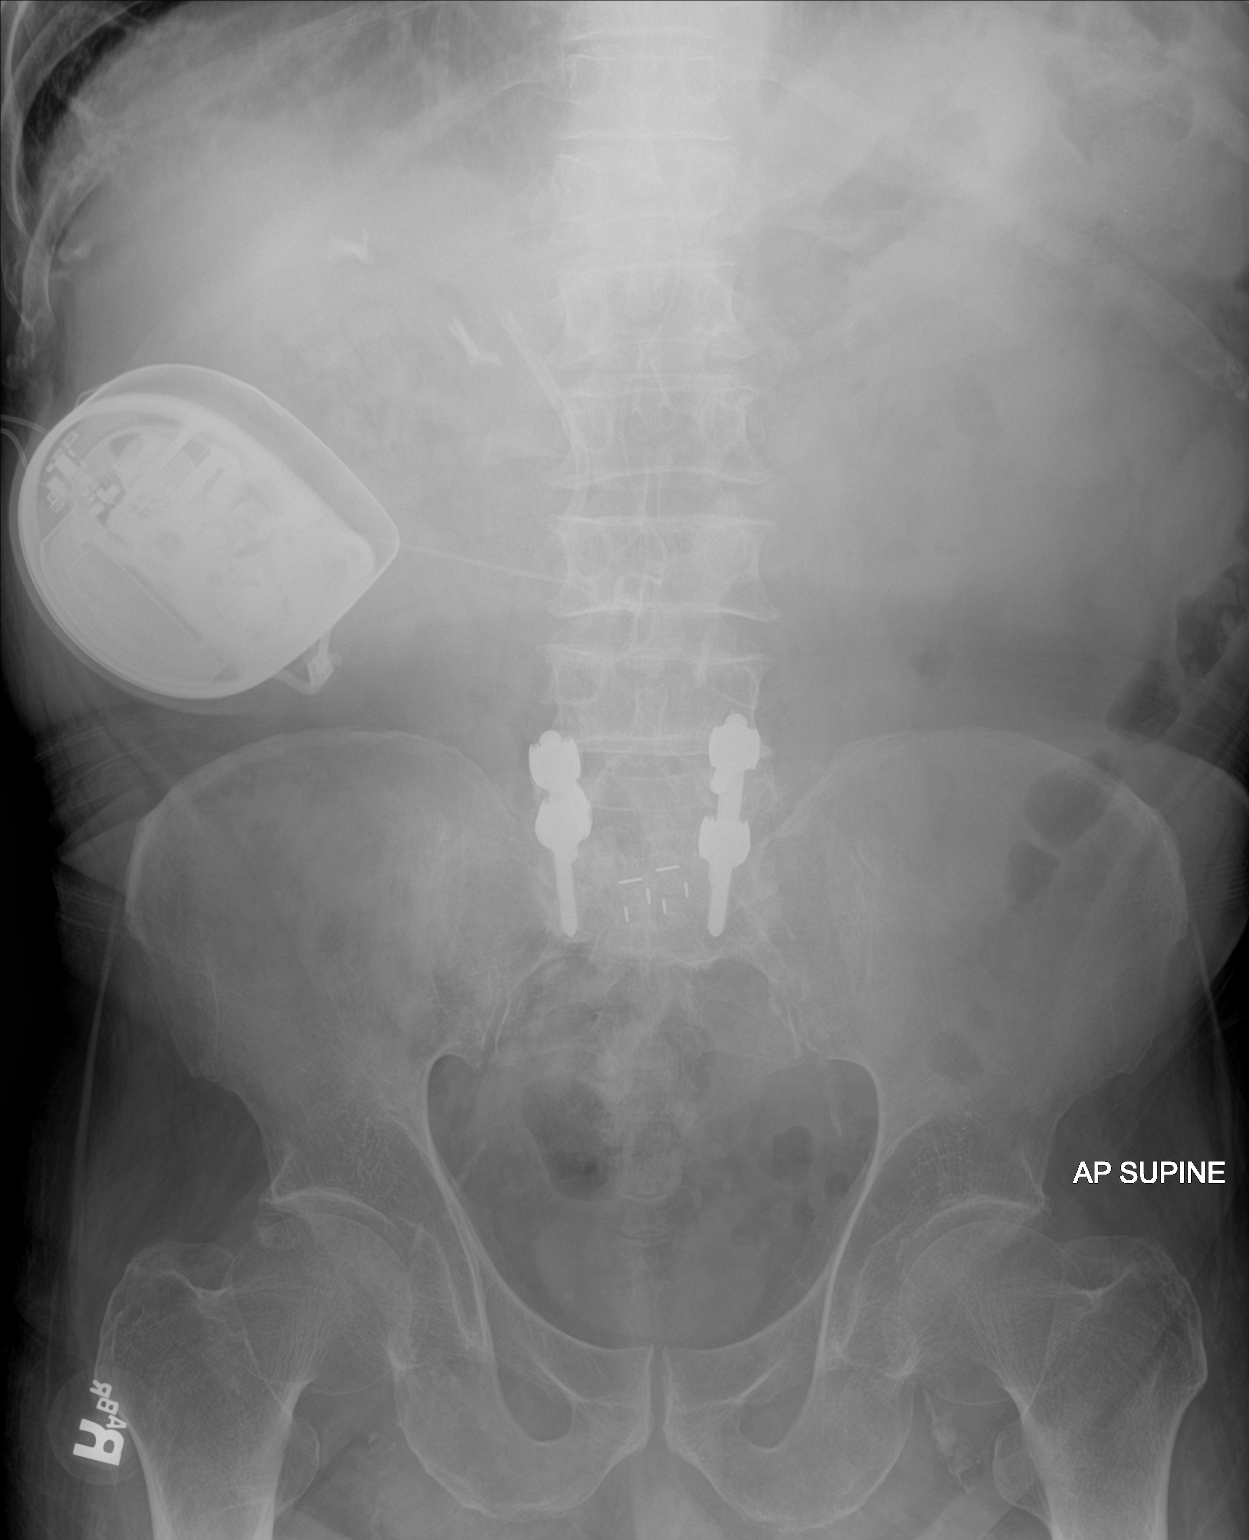

[3 of 3 positions shown; findings below may reference images not displayed]

FINDINGS: Bile duct stent. Cholecystectomy clips. The abdominal gas pattern is
negative for obstruction or perforation. No biliary or urinary
calculi are evident. Implanted stimulator superimposes the right
abdomen.

The upright view of the chest demonstrates stable mild lung base
opacities, possibly scarring or atelectasis. No consolidation. No
large effusions.
IMPRESSION: No radiographic evidence of bowel obstruction or perforation. No
acute findings in the chest.

## 2021-02-01 DEATH — deceased
